# Patient Record
Sex: Female | Born: 1960 | Race: Black or African American | Hispanic: No | Marital: Married | Smoking: Never smoker
Health system: Southern US, Community
[De-identification: ages and names within clinical notes are randomized; demographics above are authoritative.]

## PROBLEM LIST (undated history)

## (undated) DIAGNOSIS — I10 Essential (primary) hypertension: Secondary | ICD-10-CM

## (undated) DIAGNOSIS — M1990 Unspecified osteoarthritis, unspecified site: Secondary | ICD-10-CM

## (undated) DIAGNOSIS — E119 Type 2 diabetes mellitus without complications: Secondary | ICD-10-CM

## (undated) DIAGNOSIS — J45909 Unspecified asthma, uncomplicated: Secondary | ICD-10-CM

## (undated) HISTORY — PX: ABDOMINAL HYSTERECTOMY: SHX81

## (undated) HISTORY — DX: Type 2 diabetes mellitus without complications: E11.9

## (undated) HISTORY — DX: Unspecified osteoarthritis, unspecified site: M19.90

---

## 2015-08-28 ENCOUNTER — Emergency Department (HOSPITAL_COMMUNITY)
Admission: EM | Admit: 2015-08-28 | Discharge: 2015-08-28 | Disposition: A | Discharge status: Home / Self Care | Payer: Self-pay | Attending: Emergency Medicine | Admitting: Emergency Medicine

## 2015-08-28 ENCOUNTER — Encounter (HOSPITAL_COMMUNITY): Payer: Self-pay | Admitting: Emergency Medicine

## 2015-08-28 DIAGNOSIS — E669 Obesity, unspecified: Secondary | ICD-10-CM | POA: Insufficient documentation

## 2015-08-28 DIAGNOSIS — Z7982 Long term (current) use of aspirin: Secondary | ICD-10-CM | POA: Insufficient documentation

## 2015-08-28 DIAGNOSIS — Z79899 Other long term (current) drug therapy: Secondary | ICD-10-CM | POA: Insufficient documentation

## 2015-08-28 DIAGNOSIS — R011 Cardiac murmur, unspecified: Secondary | ICD-10-CM | POA: Insufficient documentation

## 2015-08-28 DIAGNOSIS — J45909 Unspecified asthma, uncomplicated: Secondary | ICD-10-CM | POA: Insufficient documentation

## 2015-08-28 DIAGNOSIS — I10 Essential (primary) hypertension: Secondary | ICD-10-CM | POA: Insufficient documentation

## 2015-08-28 HISTORY — DX: Essential (primary) hypertension: I10

## 2015-08-28 HISTORY — DX: Unspecified asthma, uncomplicated: J45.909

## 2015-08-28 LAB — BRAIN NATRIURETIC PEPTIDE: B Natriuretic Peptide: 31.9 pg/mL (ref 0.0–100.0)

## 2015-08-28 LAB — BASIC METABOLIC PANEL
ANION GAP: 11 (ref 5–15)
BUN: 10 mg/dL (ref 6–20)
CALCIUM: 9.6 mg/dL (ref 8.9–10.3)
CHLORIDE: 101 mmol/L (ref 101–111)
CO2: 28 mmol/L (ref 22–32)
Creatinine, Ser: 0.82 mg/dL (ref 0.44–1.00)
GFR calc non Af Amer: >60 mL/min (ref >60)
Glucose, Bld: 124 mg/dL — ABNORMAL HIGH (ref 65–99)
Potassium: 3.7 mmol/L (ref 3.5–5.1)
Sodium: 140 mmol/L (ref 135–145)

## 2015-08-28 MED ORDER — LABETALOL HCL 5 MG/ML IV SOLN
20.0000 mg | Freq: Once | INTRAVENOUS | Status: AC
  Administered 2015-08-28: 20 mg via INTRAVENOUS
  Filled 2015-08-28: qty 4

## 2015-08-28 MED ORDER — LISINOPRIL 20 MG PO TABS
20.0000 mg | ORAL_TABLET | Freq: Once | ORAL | Status: AC
  Administered 2015-08-28: 20 mg via ORAL
  Filled 2015-08-28: qty 1

## 2015-08-28 MED ORDER — LISINOPRIL 20 MG PO TABS: 20.0000 mg | ORAL_TABLET | Freq: Every day | ORAL | Status: DC

## 2015-08-28 NOTE — Discharge Instructions (Signed)
Hypertension Hypertension, commonly called high blood pressure, is when the force of blood pumping through your arteries is too strong. Your arteries are the blood vessels that carry blood from your heart throughout your body. A blood pressure reading consists of a higher number over a lower number, such as 110/72. The higher number (systolic) is the pressure inside your arteries when your heart pumps. The lower number (diastolic) is the pressure inside your arteries when your heart relaxes. Ideally you want your blood pressure below 120/80. Hypertension forces your heart to work harder to pump blood. Your arteries may become narrow or stiff. Having untreated or uncontrolled hypertension can cause heart attack, stroke, kidney disease, and other problems. RISK FACTORS Some risk factors for high blood pressure are controllable. Others are not.  Risk factors you cannot control include:   Race. You may be at higher risk if you are African American.  Age. Risk increases with age.  Gender. Men are at higher risk than women before age 45 years. After age 65, women are at higher risk than men. Risk factors you can control include:  Not getting enough exercise or physical activity.  Being overweight.  Getting too much fat, sugar, calories, or salt in your diet.  Drinking too much alcohol. SIGNS AND SYMPTOMS Hypertension does not usually cause signs or symptoms. Extremely high blood pressure (hypertensive crisis) may cause headache, anxiety, shortness of breath, and nosebleed. DIAGNOSIS To check if you have hypertension, your health care provider will measure your blood pressure while you are seated, with your arm held at the level of your heart. It should be measured at least twice using the same arm. Certain conditions can cause a difference in blood pressure between your right and left arms. A blood pressure reading that is higher than normal on one occasion does not mean that you need treatment. If  it is not clear whether you have high blood pressure, you may be asked to return on a different day to have your blood pressure checked again. Or, you may be asked to monitor your blood pressure at home for 1 or more weeks. TREATMENT Treating high blood pressure includes making lifestyle changes and possibly taking medicine. Living a healthy lifestyle can help lower high blood pressure. You may need to change some of your habits. Lifestyle changes may include:  Following the DASH diet. This diet is high in fruits, vegetables, and whole grains. It is low in salt, red meat, and added sugars.  Keep your sodium intake below 2,300 mg per day.  Getting at least 30-45 minutes of aerobic exercise at least 4 times per week.  Losing weight if necessary.  Not smoking.  Limiting alcoholic beverages.  Learning ways to reduce stress. Your health care provider may prescribe medicine if lifestyle changes are not enough to get your blood pressure under control, and if one of the following is true:  You are 18-59 years of age and your systolic blood pressure is above 140.  You are 60 years of age or older, and your systolic blood pressure is above 150.  Your diastolic blood pressure is above 90.  You have diabetes, and your systolic blood pressure is over 140 or your diastolic blood pressure is over 90.  You have kidney disease and your blood pressure is above 140/90.  You have heart disease and your blood pressure is above 140/90. Your personal target blood pressure may vary depending on your medical conditions, your age, and other factors. HOME CARE INSTRUCTIONS    Have your blood pressure rechecked as directed by your health care provider.   Take medicines only as directed by your health care provider. Follow the directions carefully. Blood pressure medicines must be taken as prescribed. The medicine does not work as well when you skip doses. Skipping doses also puts you at risk for  problems.  Do not smoke.   Monitor your blood pressure at home as directed by your health care provider. SEEK MEDICAL CARE IF:   You think you are having a reaction to medicines taken.  You have recurrent headaches or feel dizzy.  You have swelling in your ankles.  You have trouble with your vision. SEEK IMMEDIATE MEDICAL CARE IF:  You develop a severe headache or confusion.  You have unusual weakness, numbness, or feel faint.  You have severe chest or abdominal pain.  You vomit repeatedly.  You have trouble breathing. MAKE SURE YOU:   Understand these instructions.  Will watch your condition.  Will get help right away if you are not doing well or get worse.   This information is not intended to replace advice given to you by your health care provider. Make sure you discuss any questions you have with your health care provider.   Document Released: 07/10/2005 Document Revised: 11/24/2014 Document Reviewed: 05/02/2013 Elsevier Interactive Patient Education 2016 [employer]vier Inc[employer]  

## 2015-08-28 NOTE — ED Provider Notes (Signed)
CSN: [id number]     Arrival date & time 08/28/15  0945 History   First MD Initiated Contact with Patient 08/28/15 [phone]     Chief Complaint  Patient presents with  . Hypertension  . Headache      HPI  Patient presents for evaluation accompanied by her daughter because of high blood pressure readings at home.  Patient has a history of hypertension. Is not taking medication for 6-7 years. Does not recall the last medicine she was on. She has a minimal headache, which she states is not unusual for her. She denies edema in her legs. No chest pain or shortness of breath. Normal urine output.  Essentially asymptomatic. However, her daughter brought her blood pressure cuff may been checking her blood pressure for 2-3 days. She's consistently over 200 systolic, over 100-120 diastolic. They became concerned. They could not find local primary care over the last few days and thus I brought her here.  Past Medical History  Diagnosis Date  . Hypertension   . Asthma    History reviewed. No pertinent past surgical history. History reviewed. No pertinent family history. Social History  Substance Use Topics  . Smoking status: Never Smoker   . Smokeless tobacco: None  . Alcohol Use: No   OB History    No data available     Review of Systems  Constitutional: Negative for fever, chills, diaphoresis, appetite change and fatigue.  HENT: Negative for mouth sores, sore throat and trouble swallowing.   Eyes: Negative for visual disturbance.  Respiratory: Negative for cough, chest tightness, shortness of breath and wheezing.   Cardiovascular: Negative for chest pain.  Gastrointestinal: Negative for nausea, vomiting, abdominal pain, diarrhea and abdominal distention.  Endocrine: Negative for polydipsia, polyphagia and polyuria.  Genitourinary: Negative for dysuria, frequency and hematuria.  Musculoskeletal: Negative for gait problem.  Skin: Negative for color change, pallor and rash.  Neurological:  Negative for dizziness, syncope, light-headedness and headaches.  Hematological: Does not bruise/bleed easily.  Psychiatric/Behavioral: Negative for behavioral problems and confusion.      Allergies  Review of patient's allergies indicates no known allergies.  Home Medications   Prior to Admission medications   Medication Sig Start Date End Date Taking? Authorizing Provider  aspirin EC 81 MG tablet Take 81 mg by mouth daily.   Yes Historical Provider, MD  calcium-vitamin D (OSCAL WITH D) 500-200 MG-UNIT tablet Take 1 tablet by mouth daily with breakfast.   Yes Historical Provider, MD  Multiple Vitamins-Minerals (MULTIVITAMIN & MINERAL PO) Take 1 tablet by mouth daily.   Yes Historical Provider, MD  Omega-3 Fatty Acids (FISH OIL) 1000 MG CAPS Take 1 capsule by mouth daily.   Yes Historical Provider, MD  lisinopril (PRINIVIL,ZESTRIL) 20 MG tablet Take 1 tablet (20 mg total) by mouth daily. 08/28/15   Rolland Porter, MD   BP 188/108 mmHg  Pulse 65  Temp(Src) 98.6 F (37 C) (Oral)  Resp 18  SpO2 100% Physical Exam  Constitutional: She is oriented to person, place, and time. She appears well-developed. No distress.  Obese pleasant black female. No dyspnea with conversation.  HENT:  Head: Normocephalic.  Eyes: Conjunctivae are normal. Pupils are equal, round, and reactive to light. No scleral icterus.  Neck: Normal range of motion. Neck supple. No thyromegaly present.  Cardiovascular: Normal rate and regular rhythm.  Exam reveals no gallop and no friction rub.   Murmur heard.  Systolic murmur is present with a grade of 2/6  2/6 systolic murmur. No  S3 or 4 gallop. Sinus rhythm. Rate 79. Recheck blood pressure the bedside to 22/112.  Pulmonary/Chest: Effort normal and breath sounds normal. No respiratory distress. She has no wheezes. She has no rales.  Clear lungs.  Abdominal: Soft. Bowel sounds are normal. She exhibits no distension. There is no tenderness. There is no rebound.   Musculoskeletal: Normal range of motion.  Neurological: She is alert and oriented to person, place, and time.  Skin: Skin is warm and dry. No rash noted.  No peripheral edema.  Psychiatric: She has a normal mood and affect. Her behavior is normal.    ED Course  Procedures (including critical care time) Labs Review Labs Reviewed  BASIC METABOLIC PANEL - Abnormal; Notable for the following:    Glucose, Bld 124 (*)    All other components within normal limits  BRAIN NATRIURETIC PEPTIDE    Imaging Review No results found. I have personally reviewed and evaluated these images and lab results as part of my medical decision-making.   EKG Interpretation None      MDM   Final diagnoses:  Essential hypertension    Plan will be initial blood pressure reduction. 20 mg IV Lopressor, first dose by mouth lisinopril. Check EKG and renal function.  12:01pm  patient continues to feel well. Reassuring labs. EKG without LVH or other concerns. Blood pressure has improved. Recheck 180/103, then 165/87. Endocrine without orthostasis. Plan is home. Encourage diet, exercise, weight control, fat and salt restriction, blood pressure compliance. Given prescription for lisinopril which she takes she will be able to afford, and take.    Rolland Porter, MD 08/28/15 1200

## 2015-08-28 NOTE — ED Notes (Signed)
Pt complaint of hypertension; reports high BP readings for 2 days in a row; hx of medication for BP; has not taken in 6-7 years; complaint of slight headache.

## 2015-09-10 ENCOUNTER — Encounter: Payer: Self-pay | Admitting: Family Medicine

## 2015-09-10 ENCOUNTER — Ambulatory Visit: Payer: Self-pay | Attending: Family Medicine | Admitting: Family Medicine

## 2015-09-10 VITALS — BP 182/94 | HR 66 | Temp 98.3°F | Resp 15 | Ht 63.5 in | Wt 248.4 lb

## 2015-09-10 DIAGNOSIS — Z7982 Long term (current) use of aspirin: Secondary | ICD-10-CM | POA: Insufficient documentation

## 2015-09-10 DIAGNOSIS — M2550 Pain in unspecified joint: Secondary | ICD-10-CM | POA: Insufficient documentation

## 2015-09-10 DIAGNOSIS — I10 Essential (primary) hypertension: Secondary | ICD-10-CM | POA: Insufficient documentation

## 2015-09-10 DIAGNOSIS — M25551 Pain in right hip: Secondary | ICD-10-CM | POA: Insufficient documentation

## 2015-09-10 DIAGNOSIS — M25552 Pain in left hip: Secondary | ICD-10-CM | POA: Insufficient documentation

## 2015-09-10 DIAGNOSIS — Z79899 Other long term (current) drug therapy: Secondary | ICD-10-CM | POA: Insufficient documentation

## 2015-09-10 DIAGNOSIS — E119 Type 2 diabetes mellitus without complications: Secondary | ICD-10-CM | POA: Insufficient documentation

## 2015-09-10 DIAGNOSIS — Z6841 Body Mass Index (BMI) 40.0 and over, adult: Secondary | ICD-10-CM | POA: Insufficient documentation

## 2015-09-10 DIAGNOSIS — E669 Obesity, unspecified: Secondary | ICD-10-CM | POA: Insufficient documentation

## 2015-09-10 LAB — LIPID PANEL
Cholesterol: 261 mg/dL — ABNORMAL HIGH (ref 125–200)
HDL: 49 mg/dL (ref >=46)
LDL CALC: 180 mg/dL — AB (ref <130)
TRIGLYCERIDES: 158 mg/dL — AB (ref <150)
Total CHOL/HDL Ratio: 5.3 Ratio — ABNORMAL HIGH (ref <=5.0)
VLDL: 32 mg/dL — AB (ref <30)

## 2015-09-10 LAB — COMPLETE METABOLIC PANEL WITH GFR
ALT: 15 U/L (ref 6–29)
AST: 17 U/L (ref 10–35)
Albumin: 4.2 g/dL (ref 3.6–5.1)
Alkaline Phosphatase: 65 U/L (ref 33–130)
BILIRUBIN TOTAL: 0.7 mg/dL (ref 0.2–1.2)
BUN: 12 mg/dL (ref 7–25)
CHLORIDE: 103 mmol/L (ref 98–110)
CO2: 31 mmol/L (ref 20–31)
CREATININE: 0.78 mg/dL (ref 0.50–1.05)
Calcium: 9.7 mg/dL (ref 8.6–10.4)
GFR, Est Non African American: 85 mL/min (ref >=60)
Glucose, Bld: 91 mg/dL (ref 65–99)
Potassium: 4.5 mmol/L (ref 3.5–5.3)
Sodium: 142 mmol/L (ref 135–146)
TOTAL PROTEIN: 7.3 g/dL (ref 6.1–8.1)

## 2015-09-10 LAB — POCT GLYCOSYLATED HEMOGLOBIN (HGB A1C): Hemoglobin A1C: 7

## 2015-09-10 MED ORDER — GLUCOSE BLOOD VI STRP: ORAL_STRIP | Status: DC

## 2015-09-10 MED ORDER — TRUEPLUS LANCETS 28G MISC: 1.0000 | Freq: Three times a day (TID) | Status: DC

## 2015-09-10 MED ORDER — TRUE METRIX METER DEVI: 1.0000 | Freq: Three times a day (TID) | Status: DC

## 2015-09-10 MED ORDER — LISINOPRIL-HYDROCHLOROTHIAZIDE 10-12.5 MG PO TABS: 2.0000 | ORAL_TABLET | Freq: Every day | ORAL | Status: DC

## 2015-09-10 MED ORDER — METFORMIN HCL 500 MG PO TABS: 500.0000 mg | ORAL_TABLET | Freq: Two times a day (BID) | ORAL | Status: DC

## 2015-09-10 MED FILL — LISINOPRIL-HCTZ 10-12.5 MG: 10-12.5 | 30 days supply | Qty: 60 | Fill #0

## 2015-09-10 MED FILL — !TRUE METRIX BLOOD GLUCOSE: 365 days supply | Qty: 1 | Fill #0

## 2015-09-10 MED FILL — TRUE METRIX TEST STRIP: 33 days supply | Qty: 100 | Fill #0

## 2015-09-10 MED FILL — TRUEplus LANCETS 28G MISC: 33 days supply | Qty: 100 | Fill #0

## 2015-09-10 MED FILL — metFORMIN HCL 500 MG TABS: 500 | 30 days supply | Qty: 60 | Fill #0

## 2015-09-10 NOTE — Patient Instructions (Signed)
Diabetes Mellitus and Food It is important for you to manage your blood sugar (glucose) level. Your blood glucose level can be greatly affected by what you eat. Eating healthier foods in the appropriate amounts throughout the day at about the same time each day will help you control your blood glucose level. It can also help slow or prevent worsening of your diabetes mellitus. Healthy eating may even help you improve the level of your blood pressure and reach or maintain a healthy weight.  General recommendations for healthful eating and cooking habits include:  Eating meals and snacks regularly. Avoid going long periods of time without eating to lose weight.  Eating a diet that consists mainly of plant-based foods, such as fruits, vegetables, nuts, legumes, and whole grains.  Using low-heat cooking methods, such as baking, instead of high-heat cooking methods, such as deep frying. Work with your dietitian to make sure you understand how to use the Nutrition Facts information on food labels. HOW CAN FOOD AFFECT ME? Carbohydrates Carbohydrates affect your blood glucose level more than any other type of food. Your dietitian will help you determine how many carbohydrates to eat at each meal and teach you how to count carbohydrates. Counting carbohydrates is important to keep your blood glucose at a healthy level, especially if you are using insulin or taking certain medicines for diabetes mellitus. Alcohol Alcohol can cause sudden decreases in blood glucose (hypoglycemia), especially if you use insulin or take certain medicines for diabetes mellitus. Hypoglycemia can be a life-threatening condition. Symptoms of hypoglycemia (sleepiness, dizziness, and disorientation) are similar to symptoms of having too much alcohol.  If your health care provider has given you approval to drink alcohol, do so in moderation and use the following guidelines:  Women should not have more than one drink per day, and men  should not have more than two drinks per day. One drink is equal to:  12 oz of beer.  5 oz of wine.  1 oz of hard liquor.  Do not drink on an empty stomach.  Keep yourself hydrated. Have water, diet soda, or unsweetened iced tea.  Regular soda, juice, and other mixers might contain a lot of carbohydrates and should be counted. WHAT FOODS ARE NOT RECOMMENDED? As you make food choices, it is important to remember that all foods are not the same. Some foods have fewer nutrients per serving than other foods, even though they might have the same number of calories or carbohydrates. It is difficult to get your body what it needs when you eat foods with fewer nutrients. Examples of foods that you should avoid that are high in calories and carbohydrates but low in nutrients include:  Trans fats (most processed foods list trans fats on the Nutrition Facts label).  Regular soda.  Juice.  Candy.  Sweets, such as cake, pie, doughnuts, and cookies.  Fried foods. WHAT FOODS CAN I EAT? Eat nutrient-rich foods, which will nourish your body and keep you healthy. The food you should eat also will depend on several factors, including:  The calories you need.  The medicines you take.  Your weight.  Your blood glucose level.  Your blood pressure level.  Your cholesterol level. You should eat a variety of foods, including:  Protein.  Lean cuts of meat.  Proteins low in saturated fats, such as fish, egg whites, and beans. Avoid processed meats.  Fruits and vegetables.  Fruits and vegetables that may help control blood glucose levels, such as apples, mangoes, and   yams.  Dairy products.  Choose fat-free or low-fat dairy products, such as milk, yogurt, and cheese.  Grains, bread, pasta, and rice.  Choose whole grain products, such as multigrain bread, whole oats, and brown rice. These foods may help control blood pressure.  Fats.  Foods containing healthful fats, such as nuts,  avocado, olive oil, canola oil, and fish. DOES EVERYONE WITH DIABETES MELLITUS HAVE THE SAME MEAL PLAN? Because every person with diabetes mellitus is different, there is not one meal plan that works for everyone. It is very important that you meet with a dietitian who will help you create a meal plan that is just right for you.   This information is not intended to replace advice given to you by your health care provider. Make sure you discuss any questions you have with your health care provider.   Document Released: 04/06/2005 Document Revised: 07/31/2014 Document Reviewed: 06/06/2013 Elsevier Interactive Patient Education 2016 Elsevier Inc.  

## 2015-09-10 NOTE — Progress Notes (Signed)
Subjective:  Patient ID: Paula Serrano, female    DOB: 31-May-1960  Age: 55 y.o. MRN: [id number]  CC: Follow-up   HPI Paula Serrano is a 55 year old woman originally from [place] who comes into the clinic to establish care today. She was recently seen at [facility] ED and blood pressure was in the accelerated range with systolics in the 200s. She was placed on lisinopril and referred to the clinic.  Today she is accompanied by her daughter and reports compliance with lisinopril however she brings in her blood pressure log from home which reveals systolics in the 160-180. She also complains of intermittent pain in her hips, knees and ankles for which she usually uses Aleve occasionally. Her blood pressure is elevated today and she reports not taking her antihypertensive in anticipation of fasting labs. Point-of-care A1c reveals a new diagnosis of type 2 diabetes mellitus with A1c of 7.  Outpatient Prescriptions Prior to Visit  Medication Sig Dispense Refill  . aspirin EC 81 MG tablet Take 81 mg by mouth daily.    . calcium-vitamin D (OSCAL WITH D) 500-200 MG-UNIT tablet Take 1 tablet by mouth daily with breakfast.    . Multiple Vitamins-Minerals (MULTIVITAMIN & MINERAL PO) Take 1 tablet by mouth daily.    . Omega-3 Fatty Acids (FISH OIL) 1000 MG CAPS Take 1 capsule by mouth daily.    Marland Kitchen lisinopril (PRINIVIL,ZESTRIL) 20 MG tablet Take 1 tablet (20 mg total) by mouth daily. 30 tablet 0   No facility-administered medications prior to visit.    ROS Review of Systems  Constitutional: Negative for activity change, appetite change and fatigue.  HENT: Negative for congestion, sinus pressure and sore throat.   Eyes: Negative for visual disturbance.  Respiratory: Negative for cough, chest tightness, shortness of breath and wheezing.   Cardiovascular: Negative for chest pain and palpitations.  Gastrointestinal: Negative for abdominal pain, constipation and abdominal distention.  Endocrine:  Negative for polydipsia.  Genitourinary: Negative for dysuria and frequency.  Musculoskeletal: Negative for back pain and arthralgias.       Joint aches in the hips, knees and ankles  Skin: Negative for rash.  Neurological: Negative for tremors, light-headedness and numbness.  Hematological: Does not bruise/bleed easily.  Psychiatric/Behavioral: Negative for behavioral problems and agitation.    Objective:  BP 182/94 mmHg  Pulse 66  Temp(Src) 98.3 F (36.8 C)  Resp 15  Ht 5' 3.5" (1.613 m)  Wt 248 lb 6.4 oz (112.674 kg)  BMI 43.31 kg/m2  SpO2 100%  BP/Weight 09/10/2015 08/28/2015  Systolic BP 182 139  Diastolic BP 94 88  Wt. (Lbs) 248.4 -  BMI 43.31 -      Physical Exam  Constitutional: She is oriented to person, place, and time. She appears well-developed and well-nourished.  Cardiovascular: Normal rate, normal heart sounds and intact distal pulses.   No murmur heard. Pulmonary/Chest: Effort normal and breath sounds normal. She has no wheezes. She has no rales. She exhibits no tenderness.  Abdominal: Soft. Bowel sounds are normal. She exhibits no distension and no mass. There is no tenderness.  Musculoskeletal: Normal range of motion.  Neurological: She is alert and oriented to person, place, and time.     Assessment & Plan:   1. Type 2 diabetes mellitus without complication, without long-term current use of insulin (HCC) Newly diagnosed with A1c of 7. Counseled on new diagnosis, management options and complications. Scheduled to see the clinical pharmacist for diabetic teaching. Advised to check blood sugars 3 times daily-before  meals 2 hours postlunch and at bedtime. We'll discuss diabetic healthcare maintenance at the next office visit - HgB A1c - COMPLETE METABOLIC PANEL WITH GFR - Lipid panel - Microalbumin/Creatinine Ratio, Urine - TRUEPLUS LANCETS 28G MISC; 1 each by Does not apply route 3 (three) times daily before meals.  Dispense: 100 each; Refill: 12 -  Blood Glucose Monitoring Suppl (TRUE METRIX METER) DEVI; 1 each by Does not apply route 3 (three) times daily before meals.  Dispense: 1 Device; Refill: 0 - glucose blood (TRUE METRIX BLOOD GLUCOSE TEST) test strip; Use 3 times daily before meals  Dispense: 100 each; Refill: 12 - metFORMIN (GLUCOPHAGE) 500 MG tablet; Take 1 tablet (500 mg total) by mouth 2 (two) times daily with a meal.  Dispense: 60 tablet; Refill: 3  2. Essential hypertension Uncontrolled. Patient is yet to take morning dose of hypertensive. Will replace lisinopril with lisinopril/HCTZ given home blood pressure log reveals elevated blood pressures despite compliance with lisinopril Low-sodium, DASH diet - lisinopril-hydrochlorothiazide (PRINZIDE,ZESTORETIC) 10-12.5 MG tablet; Take 2 tablets by mouth daily.  Dispense: 60 tablet; Refill: 3  3. Obesity Advised to increase physical activity, reduce portion sizes  4. Arthralgia Could be secondary to underlying osteoarthritis. Will hold off on NSAIDs at this time due to elevated blood pressure I will reassess at next visit if blood pressure can tolerate this.   Meds ordered this encounter  Medications  . TRUEPLUS LANCETS 28G MISC    Sig: 1 each by Does not apply route 3 (three) times daily before meals.    Dispense:  100 each    Refill:  12  . Blood Glucose Monitoring Suppl (TRUE METRIX METER) DEVI    Sig: 1 each by Does not apply route 3 (three) times daily before meals.    Dispense:  1 Device    Refill:  0  . glucose blood (TRUE METRIX BLOOD GLUCOSE TEST) test strip    Sig: Use 3 times daily before meals    Dispense:  100 each    Refill:  12  . metFORMIN (GLUCOPHAGE) 500 MG tablet    Sig: Take 1 tablet (500 mg total) by mouth 2 (two) times daily with a meal.    Dispense:  60 tablet    Refill:  3  . lisinopril-hydrochlorothiazide (PRINZIDE,ZESTORETIC) 10-12.5 MG tablet    Sig: Take 2 tablets by mouth daily.    Dispense:  60 tablet    Refill:  3    Discontinue  lisinopril    Follow-up: Return in about 2 weeks (around 09/24/2015) for Follow-up on hypertension and diabetes mellitus; place on Stacey's schedule for diabetic teaching.   Jaclyn Shaggy MD

## 2015-09-10 NOTE — Progress Notes (Signed)
Patient did not take any medications this am She is fasting She reports joint pain 7/10 from her hips down

## 2015-09-11 ENCOUNTER — Other Ambulatory Visit: Payer: Self-pay | Admitting: Family Medicine

## 2015-09-11 DIAGNOSIS — E785 Hyperlipidemia, unspecified: Secondary | ICD-10-CM | POA: Insufficient documentation

## 2015-09-11 LAB — MICROALBUMIN / CREATININE URINE RATIO
CREATININE, URINE: 336 mg/dL — AB (ref 20–320)
MICROALB UR: 27.6 mg/dL
MICROALB/CREAT RATIO: 82 ug/mg{creat} — AB (ref <30)

## 2015-09-11 MED ORDER — ATORVASTATIN CALCIUM 20 MG PO TABS: 20.0000 mg | ORAL_TABLET | Freq: Every day | ORAL | Status: DC

## 2015-09-13 ENCOUNTER — Telehealth: Payer: Self-pay | Admitting: *Deleted

## 2015-09-13 NOTE — Telephone Encounter (Signed)
Spoke to patient verified name and date of birth gave results and instructions.

## 2015-09-13 NOTE — Telephone Encounter (Signed)
-----   Message from Jaclyn Shaggy, MD sent at 09/11/2015  9:32 PM EST ----- Please inform her that her lipids are elevated and so i have sent a prescription for atorvastatin to the pharmacy. She also has microalbuminuria which could be secondary to uncontrolled hypertension coupled with DM and she is currently on an ACEI which should help with this.

## 2015-09-21 ENCOUNTER — Encounter: Payer: Self-pay | Admitting: Pharmacist

## 2015-09-21 ENCOUNTER — Ambulatory Visit: Payer: Self-pay | Attending: Family Medicine | Admitting: Pharmacist

## 2015-09-21 VITALS — BP 137/91 | HR 82

## 2015-09-21 DIAGNOSIS — E119 Type 2 diabetes mellitus without complications: Secondary | ICD-10-CM

## 2015-09-21 DIAGNOSIS — I10 Essential (primary) hypertension: Secondary | ICD-10-CM

## 2015-09-21 NOTE — Patient Instructions (Signed)
Follow up with Dr. Venetia Night  Basic Carbohydrate Counting for Diabetes Mellitus Carbohydrate counting is a method for keeping track of the amount of carbohydrates you eat. Eating carbohydrates naturally increases the level of sugar (glucose) in your blood, so it is important for you to know the amount that is okay for you to have in every meal. Carbohydrate counting helps keep the level of glucose in your blood within normal limits. The amount of carbohydrates allowed is different for every person. A dietitian can help you calculate the amount that is right for you. Once you know the amount of carbohydrates you can have, you can count the carbohydrates in the foods you want to eat. Carbohydrates are found in the following foods:  Grains, such as breads and cereals.  Dried beans and soy products.  Starchy vegetables, such as potatoes, peas, and corn.  Fruit and fruit juices.  Milk and yogurt.  Sweets and snack foods, such as cake, cookies, candy, chips, soft drinks, and fruit drinks. CARBOHYDRATE COUNTING There are two ways to count the carbohydrates in your food. You can use either of the methods or a combination of both. Reading the "Nutrition Facts" on Packaged Food The "Nutrition Facts" is an area that is included on the labels of almost all packaged food and beverages in the [place]. It includes the serving size of that food or beverage and information about the nutrients in each serving of the food, including the grams (g) of carbohydrate per serving.  Decide the number of servings of this food or beverage that you will be able to eat or drink. Multiply that number of servings by the number of grams of carbohydrate that is listed on the label for that serving. The total will be the amount of carbohydrates you will be having when you eat or drink this food or beverage. Learning Standard Serving Sizes of Food When you eat food that is not packaged or does not include "Nutrition Facts" on  the label, you need to measure the servings in order to count the amount of carbohydrates.A serving of most carbohydrate-rich foods contains about 15 g of carbohydrates. The following list includes serving sizes of carbohydrate-rich foods that provide 15 g ofcarbohydrate per serving:   1 slice of bread (1 oz) or 1 six-inch tortilla.    of a hamburger bun or English muffin.  4-6 crackers.   cup unsweetened dry cereal.    cup hot cereal.   cup rice or pasta.    cup mashed potatoes or  of a large baked potato.  1 cup fresh fruit or one small piece of fruit.    cup canned or frozen fruit or fruit juice.  1 cup milk.   cup plain fat-free yogurt or yogurt sweetened with artificial sweeteners.   cup cooked dried beans or starchy vegetable, such as peas, corn, or potatoes.  Decide the number of standard-size servings that you will eat. Multiply that number of servings by 15 (the grams of carbohydrates in that serving). For example, if you eat 2 cups of strawberries, you will have eaten 2 servings and 30 g of carbohydrates (2 servings x 15 g = 30 g). For foods such as soups and casseroles, in which more than one food is mixed in, you will need to count the carbohydrates in each food that is included. EXAMPLE OF CARBOHYDRATE COUNTING Sample Dinner  3 oz chicken breast.   cup of brown rice.   cup of corn.  1 cup milk.  1 cup strawberries with sugar-free whipped topping.  Carbohydrate Calculation Step 1: Identify the foods that contain carbohydrates:   Rice.   Corn.   Milk.   Strawberries. Step 2:Calculate the number of servings eaten of each:   2 servings of rice.   1 serving of corn.   1 serving of milk.   1 serving of strawberries. Step 3: Multiply each of those number of servings by 15 g:   2 servings of rice x 15 g = 30 g.   1 serving of corn x 15 g = 15 g.   1 serving of milk x 15 g = 15 g.   1 serving of strawberries x 15 g =  15 g. Step 4: Add together all of the amounts to find the total grams of carbohydrates eaten: 30 g + 15 g + 15 g + 15 g = 75 g.   This information is not intended to replace advice given to you by your health care provider. Make sure you discuss any questions you have with your health care provider.   Document Released: 07/10/2005 Document Revised: 07/31/2014 Document Reviewed: 06/06/2013 Elsevier Interactive Patient Education 2016 [employer] Inc[employer]  Blood Glucose Monitoring, Adult Monitoring your blood glucose (also know as blood sugar) helps you to manage your diabetes. It also helps you and your health care provider monitor your diabetes and determine how well your treatment plan is working. WHY SHOULD YOU MONITOR YOUR BLOOD GLUCOSE?  It can help you understand how food, exercise, and medicine affect your blood glucose.  It allows you to know what your blood glucose is at any given moment. You can quickly tell if you are having low blood glucose (hypoglycemia) or high blood glucose (hyperglycemia).  It can help you and your health care provider know how to adjust your medicines.  It can help you understand how to manage an illness or adjust medicine for exercise. WHEN SHOULD YOU TEST? Your health care provider will help you decide how often you should check your blood glucose. This may depend on the type of diabetes you have, your diabetes control, or the types of medicines you are taking. Be sure to write down all of your blood glucose readings so that this information can be reviewed with your health care provider. See below for examples of testing times that your health care provider may suggest. Type 1 Diabetes  Test at least 2 times per day if your diabetes is well controlled, if you are using an insulin pump, or if you perform multiple daily injections.  If your diabetes is not well controlled or if you are sick, you may need to test more often.  It is a good idea to also  test:  Before every insulin injection.  Before and after exercise.  Between meals and 2 hours after a meal.  Occasionally between 2:00 a.m. and 3:00 a.m. Type 2 Diabetes  If you are taking insulin, test at least 2 times per day. However, it is best to test before every insulin injection.  If you take medicines by mouth (orally), test 2 times a day.  If you are on a controlled diet, test once a day.  If your diabetes is not well controlled or if you are sick, you may need to monitor more often. HOW TO MONITOR YOUR BLOOD GLUCOSE Supplies Needed  Blood glucose meter.  Test strips for your meter. Each meter has its own strips. You must use the strips that go with your  own meter.  A pricking needle (lancet).  A device that holds the lancet (lancing device).  A journal or log book to write down your results. Procedure  Wash your hands with soap and water. Alcohol is not preferred.  Prick the side of your finger (not the tip) with the lancet.  Gently milk the finger until a small drop of blood appears.  Follow the instructions that come with your meter for inserting the test strip, applying blood to the strip, and using your blood glucose meter. Other Areas to Get Blood for Testing Some meters allow you to use other areas of your body (other than your finger) to test your blood. These areas are called alternative sites. The most common alternative sites are:  The forearm.  The thigh.  The back area of the lower leg.  The palm of the hand. The blood flow in these areas is slower. Therefore, the blood glucose values you get may be delayed, and the numbers are different from what you would get from your fingers. Do not use alternative sites if you think you are having hypoglycemia. Your reading will not be accurate. Always use a finger if you are having hypoglycemia. Also, if you cannot feel your lows (hypoglycemia unawareness), always use your fingers for your blood glucose  checks. ADDITIONAL TIPS FOR GLUCOSE MONITORING  Do not reuse lancets.  Always carry your supplies with you.  All blood glucose meters have a 24-hour "hotline" number to call if you have questions or need help.  Adjust (calibrate) your blood glucose meter with a control solution after finishing a few boxes of strips. BLOOD GLUCOSE RECORD KEEPING It is a good idea to keep a daily record or log of your blood glucose readings. Most glucose meters, if not all, keep your glucose records stored in the meter. Some meters come with the ability to download your records to your home computer. Keeping a record of your blood glucose readings is especially helpful if you are wanting to look for patterns. Make notes to go along with the blood glucose readings because you might forget what happened at that exact time. Keeping good records helps you and your health care provider to work together to achieve good diabetes management.    This information is not intended to replace advice given to you by your health care provider. Make sure you discuss any questions you have with your health care provider.   Document Released: 07/13/2003 Document Revised: 07/31/2014 Document Reviewed: 12/02/2012 Elsevier Interactive Patient Education 2016 [employer].  Diabetes and Exercise Exercising regularly is important. It is not just about losing weight. It has many health benefits, such as:  Improving your overall fitness, flexibility, and endurance.  Increasing your bone density.  Helping with weight control.  Decreasing your body fat.  Increasing your muscle strength.  Reducing stress and tension.  Improving your overall health. People with diabetes who exercise gain additional benefits because exercise:  Reduces appetite.  Improves the body's use of blood sugar (glucose).  Helps lower or control blood glucose.  Decreases blood pressure.  Helps control blood lipids (such as cholesterol and  triglycerides).  Improves the body's use of the hormone insulin by:  Increasing the body's insulin sensitivity.  Reducing the body's insulin needs.  Decreases the risk for heart disease because exercising:  Lowers cholesterol and triglycerides levels.  Increases the levels of good cholesterol (such as high-density lipoproteins [HDL]) in the body.  Lowers blood glucose levels. YOUR ACTIVITY PLAN  Choose an activity that you enjoy, and set realistic goals. To exercise safely, you should begin practicing any new physical activity slowly, and gradually increase the intensity of the exercise over time. Your health care provider or diabetes educator can help create an activity plan that works for you. General recommendations include:  Encouraging children to engage in at least 60 minutes of physical activity each day.  Stretching and performing strength training exercises, such as yoga or weight lifting, at least 2 times per week.  Performing a total of at least 150 minutes of moderate-intensity exercise each week, such as brisk walking or water aerobics.  Exercising at least 3 days per week, making sure you allow no more than 2 consecutive days to pass without exercising.  Avoiding long periods of inactivity (90 minutes or more). When you have to spend an extended period of time sitting down, take frequent breaks to walk or stretch. RECOMMENDATIONS FOR EXERCISING WITH TYPE 1 OR TYPE 2 DIABETES   Check your blood glucose before exercising. If blood glucose levels are greater than 240 mg/dL, check for urine ketones. Do not exercise if ketones are present.  Avoid injecting insulin into areas of the body that are going to be exercised. For example, avoid injecting insulin into:  The arms when playing tennis.  The legs when jogging.  Keep a record of:  Food intake before and after you exercise.  Expected peak times of insulin action.  Blood glucose levels before and after you  exercise.  The type and amount of exercise you have done.  Review your records with your health care provider. Your health care provider will help you to develop guidelines for adjusting food intake and insulin amounts before and after exercising.  If you take insulin or oral hypoglycemic agents, watch for signs and symptoms of hypoglycemia. They include:  Dizziness.  Shaking.  Sweating.  Chills.  Confusion.  Drink plenty of water while you exercise to prevent dehydration or heat stroke. Body water is lost during exercise and must be replaced.  Talk to your health care provider before starting an exercise program to make sure it is safe for you. Remember, almost any type of activity is better than none.   This information is not intended to replace advice given to you by your health care provider. Make sure you discuss any questions you have with your health care provider.   Document Released: 09/30/2003 Document Revised: 11/24/2014 Document Reviewed: 12/17/2012 Elsevier Interactive Patient Education [employer]

## 2015-09-21 NOTE — Progress Notes (Signed)
S:    Patient arrives in good spirits with her daughter.  Presents for diabetes evaluation, education, and management at the request of Dr. Venetia Night. Patient was referred on 09/10/15.  Patient was last seen by Primary Care Provider on 09/10/15   Patient reports Diabetes was diagnosed in February 2017.   Patient reports adherence with medications.  Current diabetes medications include: metformin 500 mg BID Current hypertension medications include: lisinopril-HCTZ 10.12.5 mg (2 tablets) daily  Patient denies hypoglycemic events.  Patient reported dietary habits:patient has a lot of questions about what to eat. She eats a diet that is high in carbohydrates and she isn't sure how to substitute other things for those foods. She is very interested in working closely with a nutritionist.   Patient reported exercise habits: none   Patient reports nocturia.  Patient denies neuropathy. Patient denies visual changes. Patient reports self foot exams.    O:  Lab Results  Component Value Date   HGBA1C 7.0 09/10/2015   Filed Vitals:   09/21/15 1643  BP: 137/91  Pulse: 82    Home fasting CBG: 100s-160s  2 hour post-prandial/random CBG: <180  A/P: Diabetes newly diagnosed currently uncontrolled based on home CBGs. Patient denies hypoglycemic events and is able to verbalize appropriate hypoglycemia management plan. Patient reports adherence with medication. Control is suboptimal due to dietary indiscretion.  Continue current medications as prescribed. Poor understanding of diabetes. Patient educated on what diabetes is, the complications of diabetes, the A1c and goal A1c, goal blood glucose measures, basic carbohydrate counting, and the importance of exercise. Patient verbalized understanding of the education and all questions were addressed.   Next A1C anticipated May 2017.    ASCVD risk greater than 7.5%. Continued Aspirin 81 mg and Continued atorvastatin 20 mg - consider increasing to 40 mg for  high dose statin in the future.   Hypertension currently slightly above goal but patient admits to being nervous about coming to visit..  Patient reports adherence with medication. Control is suboptimal due to white coat hypertension - readings at home at been normal. Continue current medications and follow up with Dr. Venetia Night.   Written patient instructions provided.  Total time in face to face counseling 20 minutes.   Follow up in Pharmacist Clinic Visit as needed.

## 2015-09-24 ENCOUNTER — Ambulatory Visit: Payer: Self-pay | Attending: Family Medicine | Admitting: Family Medicine

## 2015-09-24 ENCOUNTER — Encounter: Payer: Self-pay | Admitting: Family Medicine

## 2015-09-24 VITALS — BP 151/96 | HR 79 | Temp 98.8°F | Resp 15 | Ht 63.5 in | Wt 244.0 lb

## 2015-09-24 DIAGNOSIS — E785 Hyperlipidemia, unspecified: Secondary | ICD-10-CM | POA: Insufficient documentation

## 2015-09-24 DIAGNOSIS — M25552 Pain in left hip: Secondary | ICD-10-CM | POA: Insufficient documentation

## 2015-09-24 DIAGNOSIS — Z7982 Long term (current) use of aspirin: Secondary | ICD-10-CM | POA: Insufficient documentation

## 2015-09-24 DIAGNOSIS — E669 Obesity, unspecified: Secondary | ICD-10-CM | POA: Insufficient documentation

## 2015-09-24 DIAGNOSIS — M25551 Pain in right hip: Secondary | ICD-10-CM | POA: Insufficient documentation

## 2015-09-24 DIAGNOSIS — Z79899 Other long term (current) drug therapy: Secondary | ICD-10-CM | POA: Insufficient documentation

## 2015-09-24 DIAGNOSIS — E119 Type 2 diabetes mellitus without complications: Secondary | ICD-10-CM | POA: Insufficient documentation

## 2015-09-24 DIAGNOSIS — M1990 Unspecified osteoarthritis, unspecified site: Secondary | ICD-10-CM | POA: Insufficient documentation

## 2015-09-24 DIAGNOSIS — Z7984 Long term (current) use of oral hypoglycemic drugs: Secondary | ICD-10-CM | POA: Insufficient documentation

## 2015-09-24 DIAGNOSIS — I10 Essential (primary) hypertension: Secondary | ICD-10-CM | POA: Insufficient documentation

## 2015-09-24 DIAGNOSIS — J45909 Unspecified asthma, uncomplicated: Secondary | ICD-10-CM | POA: Insufficient documentation

## 2015-09-24 DIAGNOSIS — M2550 Pain in unspecified joint: Secondary | ICD-10-CM | POA: Insufficient documentation

## 2015-09-24 LAB — GLUCOSE, POCT (MANUAL RESULT ENTRY): POC Glucose: 79 mg/dl (ref 70–99)

## 2015-09-24 MED ORDER — LISINOPRIL-HYDROCHLOROTHIAZIDE 20-12.5 MG PO TABS: 2.0000 | ORAL_TABLET | Freq: Every day | ORAL | Status: DC

## 2015-09-24 MED ORDER — ATORVASTATIN CALCIUM 20 MG PO TABS: 20.0000 mg | ORAL_TABLET | Freq: Every day | ORAL | Status: DC

## 2015-09-24 MED FILL — LISINOPRIL-HCTZ 20-12.5 MG: 20-12.5 | 30 days supply | Qty: 60 | Fill #0

## 2015-09-24 MED FILL — ATORVASTATIN 20 MG TABLET: 20 | 30 days supply | Qty: 30 | Fill #0

## 2015-09-24 NOTE — Progress Notes (Signed)
CC: Follow-up on hypertension and diabetes  HPI: Paula HartsGladys Serrano is a 55 y.o. female with newly diagnosed type 2 diabetes mellitus (A1c 7.5), hypertension, osteoarthritis who comes into the clinic for a follow-up visit.  She was commenced on metformin at her last office visit and the dose of her antihypertensive with increased but blood pressure is still mildly elevated today even though she states that home systolic blood pressures are in the 130s. I have reviewed her blood sugar log which indicates a random sugars less than 200 and fasting sugars in the 130 to 140 range. Since her last office visit she has been seen by the clinical pharmacist for diabetic teaching and has made changes to her lifestyle and exercises regularly also adhering to a diabetic diet   She would like to see a podiatrist because she thinks she has ingrown toenail in toenails of both big toes. Had also complained of intermittent pain in her hips, knees and ankles which was thought to be secondary to osteoarthritis Patient has No headache, No chest pain, No abdominal pain - No Nausea, No new weakness tingling or numbness, No Cough - SOB.  No Known Allergies Past Medical History  Diagnosis Date  . Hypertension   . Asthma    Current Outpatient Prescriptions on File Prior to Visit  Medication Sig Dispense Refill  . aspirin EC 81 MG tablet Take 81 mg by mouth daily.    . Blood Glucose Monitoring Suppl (TRUE METRIX METER) DEVI 1 each by Does not apply route 3 (three) times daily before meals. 1 Device 0  . calcium-vitamin D (OSCAL WITH D) 500-200 MG-UNIT tablet Take 1 tablet by mouth daily with breakfast.    . glucose blood (TRUE METRIX BLOOD GLUCOSE TEST) test strip Use 3 times daily before meals 100 each 12  . lisinopril-hydrochlorothiazide (PRINZIDE,ZESTORETIC) 10-12.5 MG tablet Take 2 tablets by mouth daily. 60 tablet 3  . metFORMIN (GLUCOPHAGE) 500 MG tablet Take 1 tablet (500 mg total) by mouth 2 (two) times daily  with a meal. 60 tablet 3  . Multiple Vitamins-Minerals (MULTIVITAMIN & MINERAL PO) Take 1 tablet by mouth daily.    . Omega-3 Fatty Acids (FISH OIL) 1000 MG CAPS Take 1 capsule by mouth daily.    . TRUEPLUS LANCETS 28G MISC 1 each by Does not apply route 3 (three) times daily before meals. 100 each 12  . atorvastatin (LIPITOR) 20 MG tablet Take 1 tablet (20 mg total) by mouth daily. (Patient not taking: Reported on 09/24/2015) 30 tablet 3   No current facility-administered medications on file prior to visit.   History reviewed. No pertinent family history. Social History   Social History  . Marital Status: Married    Spouse Name: N/A  . Number of Children: N/A  . Years of Education: N/A   Occupational History  . Not on file.   Social History Main Topics  . Smoking status: Never Smoker   . Smokeless tobacco: Not on file  . Alcohol Use: No  . Drug Use: No  . Sexual Activity: Not on file   Other Topics Concern  . Not on file   Social History Narrative    Review of Systems: Constitutional: Negative for fever, chills, diaphoresis, activity change, appetite change and fatigue. HENT: Negative for ear pain, nosebleeds, congestion, facial swelling, rhinorrhea, neck pain, neck stiffness and ear discharge.  Eyes: Negative for pain, discharge, redness, itching and visual disturbance. Respiratory: Negative for cough, choking, chest tightness, shortness of breath, wheezing  and stridor.  Cardiovascular: Negative for chest pain, palpitations and leg swelling. Gastrointestinal: Negative for abdominal distention. Genitourinary: Negative for dysuria, urgency, frequency, hematuria, flank pain, decreased urine volume, difficulty urinating and dyspareunia.  Musculoskeletal: Negative for back pain, joint swelling, arthralgias and gait problem. Neurological: Negative for dizziness, tremors, seizures, syncope, facial asymmetry, speech difficulty, weakness, light-headedness, numbness and headaches.    Hematological: Negative for adenopathy. Does not bruise/bleed easily. Psychiatric/Behavioral: Negative for hallucinations, behavioral problems, confusion, dysphoric mood, decreased concentration and agitation.    Objective:   Filed Vitals:   09/24/15 1616  BP: 151/96  Pulse: 79  Temp: 98.8 F (37.1 C)  Resp: 15    Physical Exam: Constitutional: Patient appears well-developed and well-nourished. No distress. HENT: Normocephalic, atraumatic, External right and left ear normal. Oropharynx is clear and moist.  Eyes: Conjunctivae and EOM are normal. PERRLA, no scleral icterus. Neck: Normal ROM. Neck supple. No JVD. No tracheal deviation. No thyromegaly. CVS: RRR, S1/S2 +, no murmurs, no gallops, no carotid bruit.  Pulmonary: Effort and breath sounds normal, no stridor, rhonchi, wheezes, rales.  Abdominal: Soft. BS +,  no distension, tenderness, rebound or guarding.  Musculoskeletal: Normal range of motion. No edema and no tenderness.  Lymphadenopathy: No lymphadenopathy noted, cervical, inguinal or axillary Neuro: Alert. Normal reflexes, muscle tone coordination. No cranial nerve deficit. Skin: Skin is warm and dry. Onychomycosis of toenails  Psychiatric: Normal mood and affect. Behavior, judgment, thought content normal.  No results found for: WBC, HGB, HCT, MCV, PLT Lab Results  Component Value Date   CREATININE 0.78 09/10/2015   BUN 12 09/10/2015   NA 142 09/10/2015   K 4.5 09/10/2015   CL 103 09/10/2015   CO2 31 09/10/2015    Lab Results  Component Value Date   HGBA1C 7.0 09/10/2015   Lipid Panel     Component Value Date/Time   CHOL 261* 09/10/2015 1252   TRIG 158* 09/10/2015 1252   HDL 49 09/10/2015 1252   CHOLHDL 5.3* 09/10/2015 1252   VLDL 32* 09/10/2015 1252   LDLCALC 180* 09/10/2015 1252       Assessment and plan:  1. Type 2 diabetes mellitus without complication, without long-term current use of insulin (HCC) Newly diagnosed with A1c of 7. Advised to  check blood sugars 3 times daily-before meals 2 hours postlunch and at bedtime. Advised to schedule annual eye exam; referred to podiatry in house for foot exam and onychomycosis - TRUEPLUS LANCETS 28G MISC; 1 each by Does not apply route 3 (three) times daily before meals.  Dispense: 100 each; Refill: 12 - Blood Glucose Monitoring Suppl (TRUE METRIX METER) DEVI; 1 each by Does not apply route 3 (three) times daily before meals.  Dispense: 1 Device; Refill: 0 - glucose blood (TRUE METRIX BLOOD GLUCOSE TEST) test strip; Use 3 times daily before meals  Dispense: 100 each; Refill: 12 - metFORMIN (GLUCOPHAGE) 500 MG tablet; Take 1 tablet (500 mg total) by mouth 2 (two) times daily with a meal.  Dispense: 60 tablet; Refill: 3  2. Essential hypertension Uncontrolled. Increased dose of lisinopril/HCTZ Low-sodium, DASH diet  3. Obesity She has been commended on weight loss of 4 lbs in the last 3 weeks Advised to increase physical activity, reduce portion sizes  4. Arthralgia Could be secondary to underlying osteoarthritis. Advised to use OTC ibuprofen.     5. Hyperlipidemia Yet to pickup Lipitor which I have advised her to do Low cholesterol diet, increase physical activity and lifestyle changes  Jaclyn Shaggy, MD.  [facility] [phone] 09/24/2015, 4:22 PM

## 2015-09-24 NOTE — Progress Notes (Signed)
Reports no issues Lost her atorvastatin and would like a paper script

## 2015-09-24 NOTE — Patient Instructions (Signed)
Discontinue lisinopril/HCTZ 10/12.5mg  and commenced 2 tablets of 20/12.5 daily.

## 2015-09-28 ENCOUNTER — Ambulatory Visit: Payer: Self-pay | Admitting: Pharmacist

## 2015-10-07 MED FILL — TRUE METRIX TEST STRIP: 33 days supply | Qty: 100 | Fill #1

## 2015-10-07 MED FILL — TRUEplus LANCETS 28G MISC: 33 days supply | Qty: 100 | Fill #1

## 2015-10-29 ENCOUNTER — Ambulatory Visit: Payer: Self-pay | Admitting: Family Medicine

## 2015-10-29 MED FILL — ATORVASTATIN 20 MG TABLET: 20 | 30 days supply | Qty: 30 | Fill #1

## 2015-10-29 MED FILL — LISINOPRIL-HCTZ 20-12.5 MG: 20-12.5 | 30 days supply | Qty: 60 | Fill #1

## 2015-11-24 ENCOUNTER — Encounter: Payer: Self-pay | Admitting: Family Medicine

## 2015-11-24 ENCOUNTER — Ambulatory Visit: Payer: Self-pay | Attending: Family Medicine | Admitting: Family Medicine

## 2015-11-24 VITALS — BP 130/80 | HR 80 | Temp 98.2°F | Resp 16 | Ht 63.5 in | Wt 231.6 lb

## 2015-11-24 DIAGNOSIS — Z7984 Long term (current) use of oral hypoglycemic drugs: Secondary | ICD-10-CM | POA: Insufficient documentation

## 2015-11-24 DIAGNOSIS — E785 Hyperlipidemia, unspecified: Secondary | ICD-10-CM | POA: Insufficient documentation

## 2015-11-24 DIAGNOSIS — I10 Essential (primary) hypertension: Secondary | ICD-10-CM | POA: Insufficient documentation

## 2015-11-24 DIAGNOSIS — Z79899 Other long term (current) drug therapy: Secondary | ICD-10-CM | POA: Insufficient documentation

## 2015-11-24 DIAGNOSIS — E119 Type 2 diabetes mellitus without complications: Secondary | ICD-10-CM | POA: Insufficient documentation

## 2015-11-24 DIAGNOSIS — M2550 Pain in unspecified joint: Secondary | ICD-10-CM | POA: Insufficient documentation

## 2015-11-24 DIAGNOSIS — Z7982 Long term (current) use of aspirin: Secondary | ICD-10-CM | POA: Insufficient documentation

## 2015-11-24 LAB — POCT GLYCOSYLATED HEMOGLOBIN (HGB A1C): HEMOGLOBIN A1C: 6.2

## 2015-11-24 LAB — GLUCOSE, POCT (MANUAL RESULT ENTRY): POC GLUCOSE: 105 mg/dL — AB (ref 70–99)

## 2015-11-24 MED ORDER — METFORMIN HCL 500 MG PO TABS: 500.0000 mg | ORAL_TABLET | Freq: Two times a day (BID) | ORAL | Status: DC

## 2015-11-24 MED ORDER — ATORVASTATIN CALCIUM 20 MG PO TABS: 20.0000 mg | ORAL_TABLET | Freq: Every day | ORAL | Status: DC

## 2015-11-24 MED ORDER — LISINOPRIL-HYDROCHLOROTHIAZIDE 20-12.5 MG PO TABS: 2.0000 | ORAL_TABLET | Freq: Every day | ORAL | Status: DC

## 2015-11-24 MED FILL — metFORMIN HCL 500 MG TABS: 500 | 30 days supply | Qty: 60 | Fill #0

## 2015-11-24 MED FILL — LISINOPRIL-HCTZ 20-12.5 MG: 20-12.5 | 30 days supply | Qty: 60 | Fill #0

## 2015-11-24 MED FILL — ?ATORVASTATIN 20 MG TABLET: 20 | 30 days supply | Qty: 30 | Fill #0

## 2015-11-24 NOTE — Progress Notes (Signed)
Subjective:  Patient ID: Paula Serrano, female    DOB: 05/05/1960  Age: 55 y.o. MRN: [id number]  CC: Hypertension; Diabetes; and Follow-up   HPI Paula HartsGladys Arkin with newly diagnosed type 2 diabetes mellitus (A1c 7.0), hypertension,Hyperlipidemia osteoarthritis who comes into the clinic for a follow-up visit.  She remains compliant with a diabetic diet, a prescribed exercise regimen, metformin, statin and has lost 17 pounds since her diagnosis 3 months ago. She last had an eye exam last year; denies numbness or tingling extremity and denies polyuria or hypoglycemia. Blood sugars are in the 130s and at other times in the 90s fasting. She will be returning to [place] and would like a 90 day supply of her medications.  Continues to have pain in her hips, knees and feet and waking up in the morning but pain resolves as the day progresses.    Outpatient Prescriptions Prior to Visit  Medication Sig Dispense Refill  . aspirin EC 81 MG tablet Take 81 mg by mouth daily.    . Blood Glucose Monitoring Suppl (TRUE METRIX METER) DEVI 1 each by Does not apply route 3 (three) times daily before meals. 1 Device 0  . calcium-vitamin D (OSCAL WITH D) 500-200 MG-UNIT tablet Take 1 tablet by mouth daily with breakfast.    . glucose blood (TRUE METRIX BLOOD GLUCOSE TEST) test strip Use 3 times daily before meals 100 each 12  . Multiple Vitamins-Minerals (MULTIVITAMIN & MINERAL PO) Take 1 tablet by mouth daily.    . Omega-3 Fatty Acids (FISH OIL) 1000 MG CAPS Take 1 capsule by mouth daily.    . TRUEPLUS LANCETS 28G MISC 1 each by Does not apply route 3 (three) times daily before meals. 100 each 12  . atorvastatin (LIPITOR) 20 MG tablet Take 1 tablet (20 mg total) by mouth daily. 30 tablet 3  . lisinopril-hydrochlorothiazide (ZESTORETIC) 20-12.5 MG tablet Take 2 tablets by mouth daily. 60 tablet 3  . metFORMIN (GLUCOPHAGE) 500 MG tablet Take 1 tablet (500 mg total) by mouth 2 (two) times daily with a meal. 60  tablet 3   No facility-administered medications prior to visit.    ROS Review of Systems  Constitutional: Negative for activity change, appetite change and fatigue.  HENT: Negative for congestion, sinus pressure and sore throat.   Eyes: Negative for visual disturbance.  Respiratory: Negative for cough, chest tightness, shortness of breath and wheezing.   Cardiovascular: Negative for chest pain and palpitations.  Gastrointestinal: Negative for abdominal pain, constipation and abdominal distention.  Endocrine: Negative for polydipsia.  Genitourinary: Negative for dysuria and frequency.  Musculoskeletal: Positive for arthralgias (joint pains in the hips, knees and feet). Negative for back pain.  Skin: Negative for rash.  Neurological: Negative for tremors, light-headedness and numbness.  Hematological: Does not bruise/bleed easily.  Psychiatric/Behavioral: Negative for behavioral problems and agitation.    Objective:  BP 145/82 mmHg  Pulse 80  Temp(Src) 98.2 F (36.8 C) (Oral)  Resp 16  Ht 5' 3.5" (1.613 m)  Wt 231 lb 9.6 oz (105.053 kg)  BMI 40.38 kg/m2  SpO2 99%  BP/Weight 11/24/2015 09/24/2015 09/21/2015  Systolic BP 145 151 137  Diastolic BP 82 96 91  Wt. (Lbs) 231.6 244 -  BMI 40.38 42.54 -      Physical Exam  Constitutional: She is oriented to person, place, and time. She appears well-developed and well-nourished.  Cardiovascular: Normal rate, normal heart sounds and intact distal pulses.   No murmur heard. Pulmonary/Chest: Effort normal and breath  sounds normal. She has no wheezes. She has no rales. She exhibits no tenderness.  Abdominal: Soft. Bowel sounds are normal. She exhibits no distension and no mass. There is no tenderness.  Musculoskeletal: Normal range of motion.  Neurological: She is alert and oriented to person, place, and time.     Lipid Panel     Component Value Date/Time   CHOL 261* 09/10/2015 1252   TRIG 158* 09/10/2015 1252   HDL 49 09/10/2015  1252   CHOLHDL 5.3* 09/10/2015 1252   VLDL 32* 09/10/2015 1252   LDLCALC 180* 09/10/2015 1252     Assessment & Plan:   1. Hyperlipidemia LDL is 180 which is greater than goal of less than 100 Continue low-cholesterol diet, lifestyle changes-Commended on weight loss Would love to repeat lipid panel in 1 month however the patient will be traveling out of the country and I have advised her to seek medical care for management of ongoing medical conditions. - atorvastatin (LIPITOR) 20 MG tablet; Take 1 tablet (20 mg total) by mouth daily.  Dispense: 90 tablet; Refill: 1  2. Essential hypertension Controlled - lisinopril-hydrochlorothiazide (ZESTORETIC) 20-12.5 MG tablet; Take 2 tablets by mouth daily.  Dispense: 180 tablet; Refill: 1  3. Type 2 diabetes mellitus without complication, without long-term current use of insulin (HCC) Significantly improved A1c of 6.2 which has trended down from 7 in 08/2015 Advised to schedule annual eye exam - POCT glucose (manual entry) - metFORMIN (GLUCOPHAGE) 500 MG tablet; Take 1 tablet (500 mg total) by mouth 2 (two) times daily with a meal.  Dispense: 180 tablet; Refill: 1 - HgB A1c  4. Arthralgia Patient will use OTC NSAIDs for symptom management   Meds ordered this encounter  Medications  . lisinopril-hydrochlorothiazide (ZESTORETIC) 20-12.5 MG tablet    Sig: Take 2 tablets by mouth daily.    Dispense:  180 tablet    Refill:  1    Discontinue previous dose  . metFORMIN (GLUCOPHAGE) 500 MG tablet    Sig: Take 1 tablet (500 mg total) by mouth 2 (two) times daily with a meal.    Dispense:  180 tablet    Refill:  1  . atorvastatin (LIPITOR) 20 MG tablet    Sig: Take 1 tablet (20 mg total) by mouth daily.    Dispense:  90 tablet    Refill:  1    Follow-up: Return in about 6 months (around 05/26/2016) for Follow-up of diabetes mellitus.   Jaclyn Shaggy MD

## 2015-11-24 NOTE — Progress Notes (Signed)
Patient's here for f/up DM, HTN.  Patient requesting med refill.

## 2015-11-29 MED FILL — TRUE METRIX TEST STRIP: 33 days supply | Qty: 100 | Fill #2

## 2015-11-29 MED FILL — TRUEplus LANCETS 28G MISC: 33 days supply | Qty: 100 | Fill #2

## 2015-12-21 MED FILL — metFORMIN HCL 500 MG TABS: 500 | 90 days supply | Qty: 180 | Fill #1

## 2015-12-21 MED FILL — TRUEplus LANCETS 28G MISC: 90 days supply | Qty: 300 | Fill #3

## 2015-12-21 MED FILL — TRUE METRIX TEST STRIP: 90 days supply | Qty: 300 | Fill #3

## 2015-12-21 MED FILL — LISINOPRIL-HCTZ 20-12.5 MG: 20-12.5 | 90 days supply | Qty: 180 | Fill #1

## 2015-12-21 MED FILL — ATORVASTATIN 20 MG TABLET: 20 | 90 days supply | Qty: 90 | Fill #1

## 2016-06-26 ENCOUNTER — Ambulatory Visit: Payer: Self-pay | Attending: Family Medicine | Admitting: Family Medicine

## 2016-06-26 ENCOUNTER — Encounter: Payer: Self-pay | Admitting: Family Medicine

## 2016-06-26 VITALS — BP 181/103 | HR 65 | Temp 98.1°F | Ht 64.0 in | Wt 206.4 lb

## 2016-06-26 DIAGNOSIS — E119 Type 2 diabetes mellitus without complications: Secondary | ICD-10-CM

## 2016-06-26 DIAGNOSIS — Z7984 Long term (current) use of oral hypoglycemic drugs: Secondary | ICD-10-CM | POA: Insufficient documentation

## 2016-06-26 DIAGNOSIS — Z23 Encounter for immunization: Secondary | ICD-10-CM

## 2016-06-26 DIAGNOSIS — Z1159 Encounter for screening for other viral diseases: Secondary | ICD-10-CM

## 2016-06-26 DIAGNOSIS — Z7982 Long term (current) use of aspirin: Secondary | ICD-10-CM | POA: Insufficient documentation

## 2016-06-26 DIAGNOSIS — I10 Essential (primary) hypertension: Secondary | ICD-10-CM

## 2016-06-26 DIAGNOSIS — E7800 Pure hypercholesterolemia, unspecified: Secondary | ICD-10-CM

## 2016-06-26 LAB — POCT GLYCOSYLATED HEMOGLOBIN (HGB A1C): HEMOGLOBIN A1C: 5.7

## 2016-06-26 LAB — GLUCOSE, POCT (MANUAL RESULT ENTRY): POC GLUCOSE: 134 mg/dL — AB (ref 70–99)

## 2016-06-26 MED ORDER — ATORVASTATIN CALCIUM 20 MG PO TABS: 20.0000 mg | ORAL_TABLET | Freq: Every day | ORAL | 1 refills | Status: DC

## 2016-06-26 MED ORDER — LISINOPRIL-HYDROCHLOROTHIAZIDE 20-12.5 MG PO TABS: 2.0000 | ORAL_TABLET | Freq: Every day | ORAL | 1 refills | Status: DC

## 2016-06-26 MED ORDER — METFORMIN HCL 500 MG PO TABS: 500.0000 mg | ORAL_TABLET | Freq: Two times a day (BID) | ORAL | 1 refills | Status: DC

## 2016-06-26 MED FILL — LISINOPRIL-HCTZ 20-12.5 MG: 20-12.5 | 30 days supply | Qty: 60 | Fill #0

## 2016-06-26 MED FILL — ATORVASTATIN 20 MG TABLET: 20 | 30 days supply | Qty: 30 | Fill #0

## 2016-06-26 MED FILL — metFORMIN HCL 500 MG TABS: 500 | 30 days supply | Qty: 60 | Fill #0

## 2016-06-26 NOTE — Patient Instructions (Signed)
Hypertension Hypertension, commonly called high blood pressure, is when the force of blood pumping through your arteries is too strong. Your arteries are the blood vessels that carry blood from your heart throughout your body. A blood pressure reading consists of a higher number over a lower number, such as 110/72. The higher number (systolic) is the pressure inside your arteries when your heart pumps. The lower number (diastolic) is the pressure inside your arteries when your heart relaxes. Ideally you want your blood pressure below 120/80. Hypertension forces your heart to work harder to pump blood. Your arteries may become narrow or stiff. Having untreated or uncontrolled hypertension can cause heart attack, stroke, kidney disease, and other problems. What increases the risk? Some risk factors for high blood pressure are controllable. Others are not. Risk factors you cannot control include:  Race. You may be at higher risk if you are African American.  Age. Risk increases with age.  Gender. Men are at higher risk than women before age 45 years. After age 65, women are at higher risk than men. Risk factors you can control include:  Not getting enough exercise or physical activity.  Being overweight.  Getting too much fat, sugar, calories, or salt in your diet.  Drinking too much alcohol. What are the signs or symptoms? Hypertension does not usually cause signs or symptoms. Extremely high blood pressure (hypertensive crisis) may cause headache, anxiety, shortness of breath, and nosebleed. How is this diagnosed? To check if you have hypertension, your health care provider will measure your blood pressure while you are seated, with your arm held at the level of your heart. It should be measured at least twice using the same arm. Certain conditions can cause a difference in blood pressure between your right and left arms. A blood pressure reading that is higher than normal on one occasion does  not mean that you need treatment. If it is not clear whether you have high blood pressure, you may be asked to return on a different day to have your blood pressure checked again. Or, you may be asked to monitor your blood pressure at home for 1 or more weeks. How is this treated? Treating high blood pressure includes making lifestyle changes and possibly taking medicine. Living a healthy lifestyle can help lower high blood pressure. You may need to change some of your habits. Lifestyle changes may include:  Following the DASH diet. This diet is high in fruits, vegetables, and whole grains. It is low in salt, red meat, and added sugars.  Keep your sodium intake below 2,300 mg per day.  Getting at least 30-45 minutes of aerobic exercise at least 4 times per week.  Losing weight if necessary.  Not smoking.  Limiting alcoholic beverages.  Learning ways to reduce stress. Your health care provider may prescribe medicine if lifestyle changes are not enough to get your blood pressure under control, and if one of the following is true:  You are 18-59 years of age and your systolic blood pressure is above 140.  You are 60 years of age or older, and your systolic blood pressure is above 150.  Your diastolic blood pressure is above 90.  You have diabetes, and your systolic blood pressure is over 140 or your diastolic blood pressure is over 90.  You have kidney disease and your blood pressure is above 140/90.  You have heart disease and your blood pressure is above 140/90. Your personal target blood pressure may vary depending on your medical   conditions, your age, and other factors. Follow these instructions at home:  Have your blood pressure rechecked as directed by your health care provider.  Take medicines only as directed by your health care provider. Follow the directions carefully. Blood pressure medicines must be taken as prescribed. The medicine does not work as well when you skip  doses. Skipping doses also puts you at risk for problems.  Do not smoke.  Monitor your blood pressure at home as directed by your health care provider. Contact a health care provider if:  You think you are having a reaction to medicines taken.  You have recurrent headaches or feel dizzy.  You have swelling in your ankles.  You have trouble with your vision. Get help right away if:  You develop a severe headache or confusion.  You have unusual weakness, numbness, or feel faint.  You have severe chest or abdominal pain.  You vomit repeatedly.  You have trouble breathing. This information is not intended to replace advice given to you by your health care provider. Make sure you discuss any questions you have with your health care provider. Document Released: 07/10/2005 Document Revised: 12/16/2015 Document Reviewed: 05/02/2013 Elsevier Interactive Patient Education  2017 [employer] Inc[employer]  

## 2016-06-26 NOTE — Progress Notes (Signed)
Subjective:  Patient ID: Paula Serrano, female    DOB: 05/05/1960  Age: 55 y.o. MRN: [id number]  CC: Follow-up visit  HPI Paula Serrano is a 55 year old female with a history of type 2 diabetes mellitus (A1c 5.7), hypertension, hyperlipidemia who presents today for follow-up visit.  She has been away in [place] for the last 6 months and returned 1 week ago. Endorses running out of her antihypertensives hence elevated blood pressure.  Compliant with her metformin, has been exercising and adhering to a diabetic diet. Denies visual complaints, numbness in extremities or hypoglycemia.  Tolerating her statin and denies problems with myopathy.  Past Medical History:  Diagnosis Date  . Asthma   . Hypertension     History reviewed. No pertinent surgical history.  No Known Allergies   Outpatient Medications Prior to Visit  Medication Sig Dispense Refill  . aspirin EC 81 MG tablet Take 81 mg by mouth daily.    . Blood Glucose Monitoring Suppl (TRUE METRIX METER) DEVI 1 each by Does not apply route 3 (three) times daily before meals. 1 Device 0  . calcium-vitamin D (OSCAL WITH D) 500-200 MG-UNIT tablet Take 1 tablet by mouth daily with breakfast.    . glucose blood (TRUE METRIX BLOOD GLUCOSE TEST) test strip Use 3 times daily before meals 100 each 12  . Multiple Vitamins-Minerals (MULTIVITAMIN & MINERAL PO) Take 1 tablet by mouth daily.    . Omega-3 Fatty Acids (FISH OIL) 1000 MG CAPS Take 1 capsule by mouth daily.    . TRUEPLUS LANCETS 28G MISC 1 each by Does not apply route 3 (three) times daily before meals. 100 each 12  . atorvastatin (LIPITOR) 20 MG tablet Take 1 tablet (20 mg total) by mouth daily. 90 tablet 1  . lisinopril-hydrochlorothiazide (ZESTORETIC) 20-12.5 MG tablet Take 2 tablets by mouth daily. 180 tablet 1  . metFORMIN (GLUCOPHAGE) 500 MG tablet Take 1 tablet (500 mg total) by mouth 2 (two) times daily with a meal. 180 tablet 1   No facility-administered medications  prior to visit.     ROS Review of Systems  Constitutional: Negative for activity change, appetite change and fatigue.  HENT: Negative for congestion, sinus pressure and sore throat.   Eyes: Negative for visual disturbance.  Respiratory: Negative for cough, chest tightness, shortness of breath and wheezing.   Cardiovascular: Negative for chest pain and palpitations.  Gastrointestinal: Negative for abdominal distention, abdominal pain and constipation.  Endocrine: Negative for polydipsia.  Genitourinary: Negative for dysuria and frequency.  Musculoskeletal: Negative for arthralgias and back pain.  Skin: Negative for rash.  Neurological: Negative for tremors, light-headedness and numbness.  Hematological: Does not bruise/bleed easily.  Psychiatric/Behavioral: Negative for agitation and behavioral problems.    Objective:  BP (!) 181/103 (BP Location: Right Arm, Patient Position: Sitting, Cuff Size: Small)   Pulse 65   Temp 98.1 F (36.7 C) (Oral)   Ht 5\' 4"  (1.626 m)   Wt 206 lb 6.4 oz (93.6 kg)   SpO2 100%   BMI 35.43 kg/m   BP/Weight 06/26/2016 11/24/2015 09/24/2015  Systolic BP 181 130 151  Diastolic BP 103 80 96  Wt. (Lbs) 206.4 231.6 244  BMI 35.43 40.38 42.54      Physical Exam  Constitutional: She is oriented to person, place, and time. She appears well-developed and well-nourished.  Cardiovascular: Normal rate, normal heart sounds and intact distal pulses.   No murmur heard. Pulmonary/Chest: Effort normal and breath sounds normal. She has no wheezes. She  has no rales. She exhibits no tenderness.  Abdominal: Soft. Bowel sounds are normal. She exhibits no distension and no mass. There is no tenderness.  Musculoskeletal: Normal range of motion.  Neurological: She is alert and oriented to person, place, and time.     Lab Results  Component Value Date   HGBA1C 5.7 06/26/2016    Lipid Panel     Component Value Date/Time   CHOL 261 (H) 09/10/2015 1252   TRIG 158  (H) 09/10/2015 1252   HDL 49 09/10/2015 1252   CHOLHDL 5.3 (H) 09/10/2015 1252   VLDL 32 (H) 09/10/2015 1252   LDLCALC 180 (H) 09/10/2015 1252    CMP Latest Ref Rng & Units 09/10/2015 08/28/2015  Glucose 65 - 99 mg/dL 91 161(W124(H)  BUN 7 - 25 mg/dL 12 10  Creatinine 9.600.50 - 1.05 mg/dL 4.540.78 0.980.82  Sodium 119135 - 146 mmol/L 142 140  Potassium 3.5 - 5.3 mmol/L 4.5 3.7  Chloride 98 - 110 mmol/L 103 101  CO2 20 - 31 mmol/L 31 28  Calcium 8.6 - 10.4 mg/dL 9.7 9.6  Total Protein 6.1 - 8.1 g/dL 7.3 -  Total Bilirubin 0.2 - 1.2 mg/dL 0.7 -  Alkaline Phos 33 - 130 U/L 65 -  AST 10 - 35 U/L 17 -  ALT 6 - 29 U/L 15 -       Assessment & Plan:   1. Type 2 diabetes mellitus without complication, without long-term current use of insulin (HCC) Controlled with A1c of 5.7 Continue diabetic diet Advised to schedule annual eye exam - Glucose (CBG) - HgB A1c - metFORMIN (GLUCOPHAGE) 500 MG tablet; Take 1 tablet (500 mg total) by mouth 2 (two) times daily with a meal.  Dispense: 180 tablet; Refill: 1  2. Essential hypertension Uncontrolled due to running out of her antihypertensives - lisinopril-hydrochlorothiazide (ZESTORETIC) 20-12.5 MG tablet; Take 2 tablets by mouth daily.  Dispense: 180 tablet; Refill: 1 - COMPLETE METABOLIC PANEL WITH GFR; Future  3. Pure hypercholesterolemia Uncontrolled Low-cholesterol diet, lifestyle modification Will send off lipid panel to assess for effectiveness of Lipitor - atorvastatin (LIPITOR) 20 MG tablet; Take 1 tablet (20 mg total) by mouth daily.  Dispense: 90 tablet; Refill: 1 - Lipid panel; Future  4. Screening for viral disease - Hepatitis C antibody, reflex; Future   Meds ordered this encounter  Medications  . metFORMIN (GLUCOPHAGE) 500 MG tablet    Sig: Take 1 tablet (500 mg total) by mouth 2 (two) times daily with a meal.    Dispense:  180 tablet    Refill:  1  . lisinopril-hydrochlorothiazide (ZESTORETIC) 20-12.5 MG tablet    Sig: Take 2 tablets  by mouth daily.    Dispense:  180 tablet    Refill:  1  . atorvastatin (LIPITOR) 20 MG tablet    Sig: Take 1 tablet (20 mg total) by mouth daily.    Dispense:  90 tablet    Refill:  1    Follow-up: Return in about 1 week (around 07/03/2016) for Follow-up on hypertension.   Jaclyn ShaggyEnobong Amao MD

## 2016-06-27 ENCOUNTER — Ambulatory Visit: Payer: Self-pay | Attending: Family Medicine

## 2016-06-27 DIAGNOSIS — E7800 Pure hypercholesterolemia, unspecified: Secondary | ICD-10-CM | POA: Insufficient documentation

## 2016-06-27 DIAGNOSIS — Z1159 Encounter for screening for other viral diseases: Secondary | ICD-10-CM | POA: Insufficient documentation

## 2016-06-27 DIAGNOSIS — I10 Essential (primary) hypertension: Secondary | ICD-10-CM | POA: Insufficient documentation

## 2016-06-27 LAB — COMPLETE METABOLIC PANEL WITH GFR
ALT: 10 U/L (ref 6–29)
AST: 13 U/L (ref 10–35)
Albumin: 4.3 g/dL (ref 3.6–5.1)
Alkaline Phosphatase: 59 U/L (ref 33–130)
BUN: 16 mg/dL (ref 7–25)
CALCIUM: 9.7 mg/dL (ref 8.6–10.4)
CHLORIDE: 102 mmol/L (ref 98–110)
CO2: 30 mmol/L (ref 20–31)
CREATININE: 0.9 mg/dL (ref 0.50–1.05)
GFR, Est African American: 82 mL/min (ref >=60)
GFR, Est Non African American: 71 mL/min (ref >=60)
GLUCOSE: 85 mg/dL (ref 65–99)
Potassium: 4.2 mmol/L (ref 3.5–5.3)
Sodium: 139 mmol/L (ref 135–146)
Total Bilirubin: 0.8 mg/dL (ref 0.2–1.2)
Total Protein: 7.3 g/dL (ref 6.1–8.1)

## 2016-06-27 LAB — LIPID PANEL
CHOL/HDL RATIO: 4.7 ratio (ref <5.0)
Cholesterol: 231 mg/dL — ABNORMAL HIGH (ref <200)
HDL: 49 mg/dL — AB (ref >50)
LDL CALC: 142 mg/dL — AB (ref <100)
Triglycerides: 200 mg/dL — ABNORMAL HIGH (ref <150)
VLDL: 40 mg/dL — ABNORMAL HIGH (ref <30)

## 2016-06-27 NOTE — Progress Notes (Signed)
Patient here for lab visit only 

## 2016-06-28 ENCOUNTER — Other Ambulatory Visit: Payer: Self-pay | Admitting: Family Medicine

## 2016-06-28 DIAGNOSIS — E7800 Pure hypercholesterolemia, unspecified: Secondary | ICD-10-CM

## 2016-06-28 LAB — HEPATITIS C ANTIBODY: HCV AB: NEGATIVE

## 2016-06-28 MED ORDER — ATORVASTATIN CALCIUM 40 MG PO TABS: 40.0000 mg | ORAL_TABLET | Freq: Every day | ORAL | 1 refills | Status: DC

## 2016-07-05 ENCOUNTER — Ambulatory Visit: Payer: Self-pay | Attending: Family Medicine | Admitting: Family Medicine

## 2016-07-05 ENCOUNTER — Encounter: Payer: Self-pay | Admitting: Family Medicine

## 2016-07-05 VITALS — BP 120/80 | HR 66 | Temp 98.5°F | Resp 20 | Ht 64.0 in | Wt 201.0 lb

## 2016-07-05 DIAGNOSIS — Z7984 Long term (current) use of oral hypoglycemic drugs: Secondary | ICD-10-CM | POA: Insufficient documentation

## 2016-07-05 DIAGNOSIS — Z79899 Other long term (current) drug therapy: Secondary | ICD-10-CM | POA: Insufficient documentation

## 2016-07-05 DIAGNOSIS — Z7982 Long term (current) use of aspirin: Secondary | ICD-10-CM | POA: Insufficient documentation

## 2016-07-05 DIAGNOSIS — E7800 Pure hypercholesterolemia, unspecified: Secondary | ICD-10-CM | POA: Insufficient documentation

## 2016-07-05 DIAGNOSIS — E119 Type 2 diabetes mellitus without complications: Secondary | ICD-10-CM | POA: Insufficient documentation

## 2016-07-05 DIAGNOSIS — I10 Essential (primary) hypertension: Secondary | ICD-10-CM | POA: Insufficient documentation

## 2016-07-05 LAB — GLUCOSE, POCT (MANUAL RESULT ENTRY): POC GLUCOSE: 79 mg/dL (ref 70–99)

## 2016-07-05 MED FILL — LISINOPRIL-HCTZ 20-12.5 MG: 20-12.5 | 60 days supply | Qty: 120 | Fill #1

## 2016-07-05 MED FILL — metFORMIN HCL 500 MG TABS: 500 | 60 days supply | Qty: 120 | Fill #1

## 2016-07-05 NOTE — Progress Notes (Signed)
Subjective:  Patient ID: Paula Serrano, female    DOB: 03/27/1960  Age: 55 y.o. MRN: [id number]  CC: Hypertension   HPI Paula Serrano is a 55 year old female with a history of type 2 diabetes mellitus (A1c 5.7), hypertension, hyperlipidemia who presents today for follow up on her hypertension as her blood pressure had been severely elevated at her last office visit due to running out of medications.  Blood pressure is normal today. Results of labs revealed elevated lipids for which her atorvastatin had been increased from 20 mg to 40 mg. She continues to adhere to low-sodium, low-cholesterol, diabetic diet and exercises regularly.   Past Medical History:  Diagnosis Date  . Asthma   . Hypertension     No past surgical history on file.  No Known Allergies   Outpatient Medications Prior to Visit  Medication Sig Dispense Refill  . aspirin EC 81 MG tablet Take 81 mg by mouth daily.    Marland Kitchen. atorvastatin (LIPITOR) 40 MG tablet Take 1 tablet (40 mg total) by mouth daily. 90 tablet 1  . calcium-vitamin D (OSCAL WITH D) 500-200 MG-UNIT tablet Take 1 tablet by mouth daily with breakfast.    . lisinopril-hydrochlorothiazide (ZESTORETIC) 20-12.5 MG tablet Take 2 tablets by mouth daily. 180 tablet 1  . metFORMIN (GLUCOPHAGE) 500 MG tablet Take 1 tablet (500 mg total) by mouth 2 (two) times daily with a meal. 180 tablet 1  . Multiple Vitamins-Minerals (MULTIVITAMIN & MINERAL PO) Take 1 tablet by mouth daily.    . Omega-3 Fatty Acids (FISH OIL) 1000 MG CAPS Take 1 capsule by mouth daily.    . Blood Glucose Monitoring Suppl (TRUE METRIX METER) DEVI 1 each by Does not apply route 3 (three) times daily before meals. 1 Device 0  . glucose blood (TRUE METRIX BLOOD GLUCOSE TEST) test strip Use 3 times daily before meals 100 each 12  . TRUEPLUS LANCETS 28G MISC 1 each by Does not apply route 3 (three) times daily before meals. 100 each 12   No facility-administered medications prior to visit.      ROS Review of Systems  Constitutional: Negative for activity change and appetite change.  HENT: Negative for sinus pressure and sore throat.   Respiratory: Negative for chest tightness, shortness of breath and wheezing.   Cardiovascular: Negative for chest pain and palpitations.  Gastrointestinal: Negative for abdominal distention, abdominal pain and constipation.  Genitourinary: Negative.   Musculoskeletal: Negative.   Psychiatric/Behavioral: Negative for behavioral problems and dysphoric mood.    Objective:  BP 120/80   Pulse 66   Temp 98.5 F (36.9 C) (Oral)   Resp 20   Ht 5\' 4"  (1.626 m)   Wt 201 lb (91.2 kg)   SpO2 100%   BMI 34.50 kg/m   BP/Weight 07/05/2016 06/26/2016 11/24/2015  Systolic BP 120 181 130  Diastolic BP 80 103 80  Wt. (Lbs) 201 206.4 231.6  BMI 34.5 35.43 40.38      Physical Exam  Constitutional: She is oriented to person, place, and time. She appears well-developed and well-nourished.  Cardiovascular: Normal rate, normal heart sounds and intact distal pulses.   No murmur heard. Pulmonary/Chest: Effort normal and breath sounds normal. She has no wheezes. She has no rales. She exhibits no tenderness.  Abdominal: Soft. Bowel sounds are normal. She exhibits no distension and no mass. There is no tenderness.  Musculoskeletal: Normal range of motion.  Neurological: She is alert and oriented to person, place, and time.  Skin: Skin  is warm and dry.  Psychiatric: She has a normal mood and affect.     Lab Results  Component Value Date   HGBA1C 5.7 06/26/2016   Lipid Panel     Component Value Date/Time   CHOL 231 (H) 06/27/2016 0918   TRIG 200 (H) 06/27/2016 0918   HDL 49 (L) 06/27/2016 0918   CHOLHDL 4.7 06/27/2016 0918   VLDL 40 (H) 06/27/2016 0918   LDLCALC 142 (H) 06/27/2016 0918     Assessment & Plan:   1. Type 2 diabetes mellitus without complication, without long-term current use of insulin (HCC) Controlled with A1c of  5.7 Continue medications, diabetic diet - POCT glucose (manual entry)  2. Essential hypertension Controlled Low-sodium diet, lifestyle modifications  3. Pure hypercholesterolemia Uncontrolled Atorvastatin had been increased to 40 mg Low-cholesterol diet   No orders of the defined types were placed in this encounter.   Follow-up: Return in about 3 months (around 10/03/2016) for Follow-up on diabetes mellitus.   Jaclyn ShaggyEnobong Amao MD

## 2016-07-05 NOTE — Patient Instructions (Signed)
Dyslipidemia Dyslipidemia is an imbalance of waxy, fat-like substances (lipids) in the blood. The body needs lipids in small amounts. Dyslipidemia often involves a high level of cholesterol or triglycerides, which are types of lipids. Common forms of dyslipidemia include:  High levels of bad cholesterol (LDL cholesterol). LDL is the type of cholesterol that causes fatty deposits (plaques) to build up in the blood vessels that carry blood away from your heart (arteries).  Low levels of good cholesterol (HDL cholesterol). HDL cholesterol is the type of cholesterol that protects against heart disease. High levels of HDL remove the LDL buildup from arteries.  High levels of triglycerides. Triglycerides are a fatty substance in the blood that is linked to a buildup of plaques in the arteries. You can develop dyslipidemia because of the genes you are born with (primary dyslipidemia) or changes that occur during your life (secondary dyslipidemia), or as a side effect of certain medical treatments. What are the causes? Primary dyslipidemia is caused by changes (mutations) in genes that are passed down through families (inherited). These mutations cause several types of dyslipidemia. Mutations can result in disorders that make the body produce too much LDL cholesterol or triglycerides, or not enough HDL cholesterol. These disorders may lead to heart disease, arterial disease, or stroke at an early age. Causes of secondary dyslipidemia include certain lifestyle choices and diseases that lead to dyslipidemia, such as:  Eating a diet that is high in animal fat.  Not getting enough activity or exercise (having a sedentary lifestyle).  Having diabetes, kidney disease, liver disease, or thyroid disease.  Drinking large amounts of alcohol.  Using certain types of drugs. What increases the risk? You may be at greater risk for dyslipidemia if you are an older man or if you are a woman who has gone through  menopause. Other risk factors include:  Having a family history of dyslipidemia.  Taking certain medicines, including birth control pills, steroids, some diuretics, beta-blockers, and some medicines forHIV.  Smoking cigarettes.  Eating a high-fat diet.  Drinking large amounts of alcohol.  Having certain medical conditions such as diabetes, polycystic ovary syndrome (PCOS), pregnancy, kidney disease, liver disease, or hypothyroidism.  Not exercising regularly.  Being overweight or obese with too much belly fat. What are the signs or symptoms? Dyslipidemia does not usually cause any symptoms. Very high lipid levels can cause fatty bumps under the skin (xanthomas) or a white or gray ring around the black center (pupil) of the eye. Very high triglyceride levels can cause inflammation of the pancreas (pancreatitis). How is this diagnosed? Your health care provider may diagnose dyslipidemia based on a routine blood test (fasting blood test). Because most people do not have symptoms of the condition, this blood testing (lipid profile) is done on adults age 20 and older and is repeated every 5 years. This test checks:  Total cholesterol. This is a measure of the total amount of cholesterol in your blood, including LDL cholesterol, HDL cholesterol, and triglycerides. A healthy number is below 200.  LDL cholesterol. The target number for LDL cholesterol is different for each person, depending on individual risk factors. For most people, a number below 100 is healthy. Ask your health care provider what your LDL cholesterol number should be.  HDL cholesterol. An HDL level of 60 or higher is best because it helps to protect against heart disease. A number below 40 for men or below 50 for women increases the risk for heart disease.  Triglycerides. A healthy triglyceride number   is below 150. If your lipid profile is abnormal, your health care provider may do other blood tests to get more information  about your condition. How is this treated? Treatment depends on the type of dyslipidemia that you have and your other risk factors for heart disease and stroke. Your health care provider will have a target range for your lipid levels based on this information. For many people, treatment starts with lifestyle changes, such as diet and exercise. Your health care provider may recommend that you:  Get regular exercise.  Make changes to your diet.  Quit smoking if you smoke. If diet changes and exercise do not help you reach your goals, your health care provider may also prescribe medicine to lower lipids. The most commonly prescribed type of medicine lowers your LDL cholesterol (statin drug). If you have a high triglyceride level, your provider may prescribe another type of drug (fibrate) or an omega-3 fish oil supplement, or both. Follow these instructions at home:  Take over-the-counter and prescription medicines only as told by your health care provider. This includes supplements.  Get regular exercise. Start an aerobic exercise and strength training program as told by your health care provider. Ask your health care provider what activities are safe for you. Your health care provider may recommend:  30 minutes of aerobic activity 4-6 days a week. Brisk walking is an example of aerobic activity.  Strength training 2 days a week.  Eat a healthy diet as told by your health care provider. This can help you reach and maintain a healthy weight, lower your LDL cholesterol, and raise your HDL cholesterol. It may help to work with a diet and nutrition specialist (dietitian) to make a plan that is right for you. Your dietitian or health care provider may recommend:  Limiting your calories, if you are overweight.  Eating more fruits, vegetables, whole grains, fish, and lean meats.  Limiting saturated fat, trans fat, and cholesterol.  Follow instructions from your health care provider or dietitian  about eating or drinking restrictions.  Limit alcohol intake to no more than one drink per day for nonpregnant women and two drinks per day for men. One drink equals 12 oz of beer, 5 oz of wine, or 1 oz of hard liquor.  Do not use any products that contain nicotine or tobacco, such as cigarettes and e-cigarettes. If you need help quitting, ask your health care provider.  Keep all follow-up visits as told by your health care provider. This is important. Contact a health care provider if:  You are having trouble sticking to your exercise or diet plan.  You are struggling to quit smoking or control your use of alcohol. Summary  Dyslipidemia is an imbalance of waxy, fat-like substances (lipids) in the blood. The body needs lipids in small amounts. Dyslipidemia often involves a high level of cholesterol or triglycerides, which are types of lipids.  Treatment depends on the type of dyslipidemia that you have and your other risk factors for heart disease and stroke.  For many people, treatment starts with lifestyle changes, such as diet and exercise. Your health care provider may also prescribe medicine to lower lipids. This information is not intended to replace advice given to you by your health care provider. Make sure you discuss any questions you have with your health care provider. Document Released: 07/15/2013 Document Revised: 03/06/2016 Document Reviewed: 03/06/2016 Elsevier Interactive Patient Education  2017 [employer]  

## 2016-07-12 MED FILL — ATORVASTATIN 40 MG TABLET: 40 | 60 days supply | Qty: 60 | Fill #0

## 2016-10-04 ENCOUNTER — Encounter: Payer: Self-pay | Admitting: Family Medicine

## 2016-10-04 ENCOUNTER — Ambulatory Visit: Payer: Self-pay | Attending: Family Medicine | Admitting: Family Medicine

## 2016-10-04 VITALS — BP 125/76 | HR 69 | Temp 98.1°F | Ht 64.0 in | Wt 213.2 lb

## 2016-10-04 DIAGNOSIS — I10 Essential (primary) hypertension: Secondary | ICD-10-CM | POA: Insufficient documentation

## 2016-10-04 DIAGNOSIS — E1169 Type 2 diabetes mellitus with other specified complication: Secondary | ICD-10-CM | POA: Insufficient documentation

## 2016-10-04 DIAGNOSIS — Z7984 Long term (current) use of oral hypoglycemic drugs: Secondary | ICD-10-CM | POA: Insufficient documentation

## 2016-10-04 DIAGNOSIS — Z7982 Long term (current) use of aspirin: Secondary | ICD-10-CM | POA: Insufficient documentation

## 2016-10-04 DIAGNOSIS — E119 Type 2 diabetes mellitus without complications: Secondary | ICD-10-CM

## 2016-10-04 DIAGNOSIS — E6609 Other obesity due to excess calories: Secondary | ICD-10-CM

## 2016-10-04 DIAGNOSIS — E669 Obesity, unspecified: Secondary | ICD-10-CM | POA: Insufficient documentation

## 2016-10-04 DIAGNOSIS — E7800 Pure hypercholesterolemia, unspecified: Secondary | ICD-10-CM | POA: Insufficient documentation

## 2016-10-04 DIAGNOSIS — Z6836 Body mass index (BMI) 36.0-36.9, adult: Secondary | ICD-10-CM | POA: Insufficient documentation

## 2016-10-04 LAB — GLUCOSE, POCT (MANUAL RESULT ENTRY): POC GLUCOSE: 132 mg/dL — AB (ref 70–99)

## 2016-10-04 LAB — POCT GLYCOSYLATED HEMOGLOBIN (HGB A1C): HEMOGLOBIN A1C: 5.7

## 2016-10-04 MED ORDER — ATORVASTATIN CALCIUM 40 MG PO TABS: 40.0000 mg | ORAL_TABLET | Freq: Every day | ORAL | 1 refills | Status: DC

## 2016-10-04 MED ORDER — LISINOPRIL-HYDROCHLOROTHIAZIDE 20-12.5 MG PO TABS: 2.0000 | ORAL_TABLET | Freq: Every day | ORAL | 1 refills | Status: DC

## 2016-10-04 MED ORDER — METFORMIN HCL 500 MG PO TABS: 500.0000 mg | ORAL_TABLET | Freq: Two times a day (BID) | ORAL | 1 refills | Status: DC

## 2016-10-04 MED FILL — LISINOPRIL-HCTZ 20-12.5 MG: 20-12.5 | 90 days supply | Qty: 180 | Fill #0

## 2016-10-04 MED FILL — metFORMIN HCL 500 MG TABS: 500 | 90 days supply | Qty: 180 | Fill #0

## 2016-10-04 MED FILL — ATORVASTATIN 40 MG TABLET: 40 | 90 days supply | Qty: 90 | Fill #0

## 2016-10-04 NOTE — Progress Notes (Signed)
Refill meds

## 2016-10-04 NOTE — Patient Instructions (Signed)

## 2016-10-04 NOTE — Progress Notes (Signed)
Subjective:  Patient ID: Paula Serrano, female    DOB: 02/27/1961  Age: 56 y.o. MRN: [id number]  CC: Diabetes; Hyperlipidemia; and Hypertension   HPI Scarlet Abad is a 56 year old female with a history of type 2 diabetes mellitus (A1c 5.7), hypertension, hyperlipidemia, Obesity who presents today for a follow-up visit.  She endorses compliance with her medications and denies hypoglycemia or numbness in extremities. She is yet to see an ophthalmologist as she is without medical coverage and in search of cheaper options. Compliant with her low-cholesterol, low-sodium, diabetic diet.  She gained 12 pounds in the last 3 months attributed to reduced exercise due to the cold and having to babysit her 71-month-old grandsons. She has no acute concerns today.  Past Medical History:  Diagnosis Date  . Asthma   . Hypertension     History reviewed. No pertinent surgical history.  No Known Allergies   Outpatient Medications Prior to Visit  Medication Sig Dispense Refill  . aspirin EC 81 MG tablet Take 81 mg by mouth daily.    . Blood Glucose Monitoring Suppl (TRUE METRIX METER) DEVI 1 each by Does not apply route 3 (three) times daily before meals. 1 Device 0  . calcium-vitamin D (OSCAL WITH D) 500-200 MG-UNIT tablet Take 1 tablet by mouth daily with breakfast.    . glucose blood (TRUE METRIX BLOOD GLUCOSE TEST) test strip Use 3 times daily before meals 100 each 12  . Multiple Vitamins-Minerals (MULTIVITAMIN & MINERAL PO) Take 1 tablet by mouth daily.    . Omega-3 Fatty Acids (FISH OIL) 1000 MG CAPS Take 1 capsule by mouth daily.    . TRUEPLUS LANCETS 28G MISC 1 each by Does not apply route 3 (three) times daily before meals. 100 each 12  . atorvastatin (LIPITOR) 40 MG tablet Take 1 tablet (40 mg total) by mouth daily. 90 tablet 1  . lisinopril-hydrochlorothiazide (ZESTORETIC) 20-12.5 MG tablet Take 2 tablets by mouth daily. 180 tablet 1  . metFORMIN (GLUCOPHAGE) 500 MG tablet Take 1  tablet (500 mg total) by mouth 2 (two) times daily with a meal. 180 tablet 1   No facility-administered medications prior to visit.     ROS Review of Systems  Constitutional: Negative for activity change, appetite change and fatigue.  HENT: Negative for congestion, sinus pressure and sore throat.   Eyes: Negative for visual disturbance.  Respiratory: Negative for cough, chest tightness, shortness of breath and wheezing.   Cardiovascular: Negative for chest pain and palpitations.  Gastrointestinal: Negative for abdominal distention, abdominal pain and constipation.  Endocrine: Negative for polydipsia.  Genitourinary: Negative for dysuria and frequency.  Musculoskeletal: Negative for arthralgias and back pain.  Skin: Negative for rash.  Neurological: Negative for tremors, light-headedness and numbness.  Hematological: Does not bruise/bleed easily.  Psychiatric/Behavioral: Negative for agitation and behavioral problems.    Objective:  BP 125/76 (BP Location: Right Arm, Patient Position: Sitting, Cuff Size: Large)   Pulse 69   Temp 98.1 F (36.7 C) (Oral)   Ht 5\' 4"  (1.626 m)   Wt 213 lb 3.2 oz (96.7 kg)   SpO2 100%   BMI 36.60 kg/m   BP/Weight 10/04/2016 07/05/2016 06/26/2016  Systolic BP 125 120 181  Diastolic BP 76 80 103  Wt. (Lbs) 213.2 201 206.4  BMI 36.6 34.5 35.43      Physical Exam  Constitutional: She is oriented to person, place, and time. She appears well-developed and well-nourished.  Obese  HENT:  Right Ear: External ear normal.  Left Ear: External ear normal.  Cardiovascular: Normal rate, normal heart sounds and intact distal pulses.   No murmur heard. Pulmonary/Chest: Effort normal and breath sounds normal. She has no wheezes. She has no rales. She exhibits no tenderness.  Abdominal: Soft. Bowel sounds are normal. She exhibits no distension and no mass. There is no tenderness.  Musculoskeletal: Normal range of motion. She exhibits no edema or tenderness  (crepitus on range of motion of both knee).  Neurological: She is alert and oriented to person, place, and time.  Skin: Skin is warm.  Psychiatric: She has a normal mood and affect.    CMP Latest Ref Rng & Units 06/27/2016 09/10/2015 08/28/2015  Glucose 65 - 99 mg/dL 85 91 161(W)  BUN 7 - 25 mg/dL 16 12 10   Creatinine 0.50 - 1.05 mg/dL 9.60 4.54 0.98  Sodium 135 - 146 mmol/L 139 142 140  Potassium 3.5 - 5.3 mmol/L 4.2 4.5 3.7  Chloride 98 - 110 mmol/L 102 103 101  CO2 20 - 31 mmol/L 30 31 28   Calcium 8.6 - 10.4 mg/dL 9.7 9.7 9.6  Total Protein 6.1 - 8.1 g/dL 7.3 7.3 -  Total Bilirubin 0.2 - 1.2 mg/dL 0.8 0.7 -  Alkaline Phos 33 - 130 U/L 59 65 -  AST 10 - 35 U/L 13 17 -  ALT 6 - 29 U/L 10 15 -    Lipid Panel     Component Value Date/Time   CHOL 231 (H) 06/27/2016 0918   TRIG 200 (H) 06/27/2016 0918   HDL 49 (L) 06/27/2016 0918   CHOLHDL 4.7 06/27/2016 0918   VLDL 40 (H) 06/27/2016 0918   LDLCALC 142 (H) 06/27/2016 0918    Lab Results  Component Value Date   HGBA1C 5.7 10/04/2016    Assessment & Plan:   1. Type 2 diabetes mellitus without complication, without long-term current use of insulin (HCC) Controlled with A1c of 5.7 Continue current management Encouraged to set up appointment for annual eye exam - Glucose (CBG) - HgB A1c - metFORMIN (GLUCOPHAGE) 500 MG tablet; Take 1 tablet (500 mg total) by mouth 2 (two) times daily with a meal.  Dispense: 180 tablet; Refill: 1  2. Pure hypercholesterolemia Uncontrolled Continue low-cholesterol diet, weight loss and exercise Will adjust regimen accordingly after labs - atorvastatin (LIPITOR) 40 MG tablet; Take 1 tablet (40 mg total) by mouth daily.  Dispense: 90 tablet; Refill: 1 - COMPLETE METABOLIC PANEL WITH GFR; Future - Lipid Panel w/reflex Direct LDL; Future  3. Essential hypertension Controlled - lisinopril-hydrochlorothiazide (ZESTORETIC) 20-12.5 MG tablet; Take 2 tablets by mouth daily.  Dispense: 180 tablet;  Refill: 1  4. Class 2 obesity due to excess calories without serious comorbidity with body mass index (BMI) of 36.0 to 36.9 in adult Discussed weight loss Reducing portion sizes, increasing physical activity-30 minutes on 5 days of the week   Meds ordered this encounter  Medications  . atorvastatin (LIPITOR) 40 MG tablet    Sig: Take 1 tablet (40 mg total) by mouth daily.    Dispense:  90 tablet    Refill:  1    Discontinue previous dose  . lisinopril-hydrochlorothiazide (ZESTORETIC) 20-12.5 MG tablet    Sig: Take 2 tablets by mouth daily.    Dispense:  180 tablet    Refill:  1  . metFORMIN (GLUCOPHAGE) 500 MG tablet    Sig: Take 1 tablet (500 mg total) by mouth 2 (two) times daily with a meal.    Dispense:  180 tablet    Refill:  1    Follow-up: Return in about 3 months (around 01/04/2017) for follow up on Diabetetes mellitus.   Jaclyn ShaggyEnobong Amao MD

## 2016-10-05 ENCOUNTER — Ambulatory Visit: Payer: Self-pay | Attending: Family Medicine

## 2016-10-05 DIAGNOSIS — E7800 Pure hypercholesterolemia, unspecified: Secondary | ICD-10-CM | POA: Insufficient documentation

## 2016-10-05 LAB — COMPLETE METABOLIC PANEL WITH GFR
ALBUMIN: 4.3 g/dL (ref 3.6–5.1)
ALK PHOS: 60 U/L (ref 33–130)
ALT: 14 U/L (ref 6–29)
AST: 16 U/L (ref 10–35)
BILIRUBIN TOTAL: 0.6 mg/dL (ref 0.2–1.2)
BUN: 17 mg/dL (ref 7–25)
CALCIUM: 9.7 mg/dL (ref 8.6–10.4)
CO2: 29 mmol/L (ref 20–31)
CREATININE: 0.9 mg/dL (ref 0.50–1.05)
Chloride: 103 mmol/L (ref 98–110)
GFR, EST NON AFRICAN AMERICAN: 72 mL/min (ref >=60)
GFR, Est African American: 83 mL/min (ref >=60)
GLUCOSE: 89 mg/dL (ref 65–99)
Potassium: 4.1 mmol/L (ref 3.5–5.3)
Sodium: 142 mmol/L (ref 135–146)
Total Protein: 7.4 g/dL (ref 6.1–8.1)

## 2016-10-05 LAB — LIPID PANEL W/REFLEX DIRECT LDL
Cholesterol: 166 mg/dL (ref <200)
HDL: 61 mg/dL (ref >50)
LDL-CHOLESTEROL: 83 mg/dL
Non-HDL Cholesterol (Calc): 105 mg/dL (ref <130)
Total CHOL/HDL Ratio: 2.7 Ratio (ref <5.0)
Triglycerides: 123 mg/dL (ref <150)

## 2016-10-05 NOTE — Progress Notes (Signed)
Patient here for lab visit only 

## 2017-01-19 MED FILL — LISINOPRIL-HCTZ 20-12.5 MG: 20-12.5 | 90 days supply | Qty: 180 | Fill #1

## 2017-01-30 ENCOUNTER — Other Ambulatory Visit: Payer: Self-pay | Admitting: Family Medicine

## 2017-01-30 DIAGNOSIS — E119 Type 2 diabetes mellitus without complications: Secondary | ICD-10-CM

## 2017-01-30 DIAGNOSIS — E7800 Pure hypercholesterolemia, unspecified: Secondary | ICD-10-CM

## 2017-01-30 MED FILL — ?METFORMIN HCL 500MG TABLET: 500 | 90 days supply | Qty: 180 | Fill #1

## 2017-01-30 MED FILL — ATORVASTATIN 40 MG TABLET: 40 | 90 days supply | Qty: 90 | Fill #1

## 2017-02-09 ENCOUNTER — Ambulatory Visit: Payer: Self-pay | Admitting: Family Medicine

## 2017-04-30 ENCOUNTER — Encounter: Payer: Self-pay | Admitting: Family Medicine

## 2017-04-30 ENCOUNTER — Ambulatory Visit: Payer: Self-pay | Attending: Family Medicine | Admitting: Family Medicine

## 2017-04-30 VITALS — BP 135/70 | HR 82 | Temp 97.9°F | Ht 64.0 in | Wt 232.8 lb

## 2017-04-30 DIAGNOSIS — G8929 Other chronic pain: Secondary | ICD-10-CM | POA: Insufficient documentation

## 2017-04-30 DIAGNOSIS — Z7982 Long term (current) use of aspirin: Secondary | ICD-10-CM | POA: Insufficient documentation

## 2017-04-30 DIAGNOSIS — M25562 Pain in left knee: Secondary | ICD-10-CM | POA: Insufficient documentation

## 2017-04-30 DIAGNOSIS — E785 Hyperlipidemia, unspecified: Secondary | ICD-10-CM | POA: Insufficient documentation

## 2017-04-30 DIAGNOSIS — Z7984 Long term (current) use of oral hypoglycemic drugs: Secondary | ICD-10-CM | POA: Insufficient documentation

## 2017-04-30 DIAGNOSIS — Z79899 Other long term (current) drug therapy: Secondary | ICD-10-CM | POA: Insufficient documentation

## 2017-04-30 DIAGNOSIS — E7800 Pure hypercholesterolemia, unspecified: Secondary | ICD-10-CM | POA: Insufficient documentation

## 2017-04-30 DIAGNOSIS — E669 Obesity, unspecified: Secondary | ICD-10-CM | POA: Insufficient documentation

## 2017-04-30 DIAGNOSIS — E119 Type 2 diabetes mellitus without complications: Secondary | ICD-10-CM | POA: Insufficient documentation

## 2017-04-30 DIAGNOSIS — J45909 Unspecified asthma, uncomplicated: Secondary | ICD-10-CM | POA: Insufficient documentation

## 2017-04-30 DIAGNOSIS — M25561 Pain in right knee: Secondary | ICD-10-CM | POA: Insufficient documentation

## 2017-04-30 DIAGNOSIS — Z23 Encounter for immunization: Secondary | ICD-10-CM | POA: Insufficient documentation

## 2017-04-30 DIAGNOSIS — M545 Low back pain: Secondary | ICD-10-CM | POA: Insufficient documentation

## 2017-04-30 DIAGNOSIS — I10 Essential (primary) hypertension: Secondary | ICD-10-CM | POA: Insufficient documentation

## 2017-04-30 LAB — POCT GLYCOSYLATED HEMOGLOBIN (HGB A1C): HEMOGLOBIN A1C: 6.3

## 2017-04-30 LAB — GLUCOSE, POCT (MANUAL RESULT ENTRY): POC Glucose: 100 mg/dl — AB (ref 70–99)

## 2017-04-30 MED ORDER — TIZANIDINE HCL 4 MG PO TABS: 4.0000 mg | ORAL_TABLET | Freq: Three times a day (TID) | ORAL | 3 refills | Status: DC | PRN

## 2017-04-30 MED ORDER — LISINOPRIL-HYDROCHLOROTHIAZIDE 20-12.5 MG PO TABS: 2.0000 | ORAL_TABLET | Freq: Every day | ORAL | 1 refills | Status: DC

## 2017-04-30 MED ORDER — ATORVASTATIN CALCIUM 40 MG PO TABS: 40.0000 mg | ORAL_TABLET | Freq: Every day | ORAL | 1 refills | Status: DC

## 2017-04-30 MED ORDER — MELOXICAM 7.5 MG PO TABS: 7.5000 mg | ORAL_TABLET | Freq: Every day | ORAL | 3 refills | Status: DC

## 2017-04-30 MED ORDER — METFORMIN HCL 500 MG PO TABS: 500.0000 mg | ORAL_TABLET | Freq: Two times a day (BID) | ORAL | 1 refills | Status: DC

## 2017-04-30 MED FILL — ?ATORVASTATIN 40MG TABLET: 40 | 30 days supply | Qty: 30 | Fill #0

## 2017-04-30 MED FILL — tiZANidine HCL 4 MG TABS: 4 | 30 days supply | Qty: 90 | Fill #0

## 2017-04-30 MED FILL — ?METFORMIN HCL 500MG TABLET: 500 | 30 days supply | Qty: 60 | Fill #0

## 2017-04-30 NOTE — Progress Notes (Signed)
Subjective:  Patient ID: Paula Serrano, female    DOB: [birth year]  Age: 56 y.o. MRN: [id number]  CC: Diabetes and Hypertension   HPI Paula Serrano is a 56 year old female with a history of type 2 diabetes mellitus (A1c 6.3), hypertension, hyperlipidemia, Obesity who presents today for a follow-up visit.  She complains of pain in both knees which are worse when going up a hill and after walking as well as low back pain which spreads across her lumbar region that is rated as 8/10. Pain does not radiate from her back to her lower extremities but she sometimes feels like her knees will give out on her. She denies visual complaints, numbness in extremities or hypoglycemia. Used Aleve with minimal relief in symptoms.  With regards to her diabetes mellitus she has been compliant with her medications and a diabetic diet. She tolerates her antihypertensives and statin with no complaints of myalgias  Past Medical History:  Diagnosis Date  . Asthma   . Hypertension     History reviewed. No pertinent surgical history.  No Known Allergies   Outpatient Medications Prior to Visit  Medication Sig Dispense Refill  . aspirin EC 81 MG tablet Take 81 mg by mouth daily.    . Blood Glucose Monitoring Suppl (TRUE METRIX METER) DEVI 1 each by Does not apply route 3 (three) times daily before meals. 1 Device 0  . calcium-vitamin D (OSCAL WITH D) 500-200 MG-UNIT tablet Take 1 tablet by mouth daily with breakfast.    . glucose blood (TRUE METRIX BLOOD GLUCOSE TEST) test strip Use 3 times daily before meals 100 each 12  . Multiple Vitamins-Minerals (MULTIVITAMIN & MINERAL PO) Take 1 tablet by mouth daily.    . Omega-3 Fatty Acids (FISH OIL) 1000 MG CAPS Take 1 capsule by mouth daily.    . TRUEPLUS LANCETS 28G MISC 1 each by Does not apply route 3 (three) times daily before meals. 100 each 12  . atorvastatin (LIPITOR) 40 MG tablet Take 1 tablet (40 mg total) by mouth daily. 90 tablet 1  .  lisinopril-hydrochlorothiazide (ZESTORETIC) 20-12.5 MG tablet Take 2 tablets by mouth daily. 180 tablet 1  . metFORMIN (GLUCOPHAGE) 500 MG tablet Take 1 tablet (500 mg total) by mouth 2 (two) times daily with a meal. 180 tablet 1   No facility-administered medications prior to visit.     ROS Review of Systems  Constitutional: Negative for activity change, appetite change and fatigue.  HENT: Negative for congestion, sinus pressure and sore throat.   Eyes: Negative for visual disturbance.  Respiratory: Negative for cough, chest tightness, shortness of breath and wheezing.   Cardiovascular: Negative for chest pain and palpitations.  Gastrointestinal: Negative for abdominal distention, abdominal pain and constipation.  Endocrine: Negative for polydipsia.  Genitourinary: Negative for dysuria and frequency.  Musculoskeletal:       See hpi  Skin: Negative for rash.  Neurological: Negative for tremors, light-headedness and numbness.  Hematological: Does not bruise/bleed easily.  Psychiatric/Behavioral: Negative for agitation and behavioral problems.     Objective:  BP 135/70   Pulse 82   Temp 97.9 F (36.6 C) (Oral)   Ht 5' 4"  (1.626 m)   Wt 232 lb 12.8 oz (105.6 kg)   SpO2 97%   BMI 39.96 kg/m   BP/Weight 04/30/2017 10/04/2016 73/53/2992  Systolic BP 426 834 196  Diastolic BP 70 76 80  Wt. (Lbs) 232.8 213.2 201  BMI 39.96 36.6 34.5      Physical Exam  Constitutional: She is oriented to person, place, and time. She appears well-developed and well-nourished.  Cardiovascular: Normal rate, normal heart sounds and intact distal pulses.   No murmur heard. Pulmonary/Chest: Effort normal and breath sounds normal. She has no wheezes. She has no rales. She exhibits no tenderness.  Abdominal: Soft. Bowel sounds are normal. She exhibits no distension and no mass. There is no tenderness.  Musculoskeletal: Normal range of motion. She exhibits edema (slight right knee edema) and tenderness  (tenderness and crepitus on flexion and extension of both knees).  No tenderness in lumbosacral on palpation; negative straight leg raise bilaterally  Neurological: She is alert and oriented to person, place, and time.  Skin: Skin is warm and dry.  Psychiatric: She has a normal mood and affect.    CMP Latest Ref Rng & Units 10/05/2016 06/27/2016 09/10/2015  Glucose 65 - 99 mg/dL 89 85 91  BUN 7 - 25 mg/dL 17 16 12   Creatinine 0.50 - 1.05 mg/dL 0.90 0.90 0.78  Sodium 135 - 146 mmol/L 142 139 142  Potassium 3.5 - 5.3 mmol/L 4.1 4.2 4.5  Chloride 98 - 110 mmol/L 103 102 103  CO2 20 - 31 mmol/L 29 30 31   Calcium 8.6 - 10.4 mg/dL 9.7 9.7 9.7  Total Protein 6.1 - 8.1 g/dL 7.4 7.3 7.3  Total Bilirubin 0.2 - 1.2 mg/dL 0.6 0.8 0.7  Alkaline Phos 33 - 130 U/L 60 59 65  AST 10 - 35 U/L 16 13 17   ALT 6 - 29 U/L 14 10 15     Lipid Panel     Component Value Date/Time   CHOL 166 10/05/2016 0912   TRIG 123 10/05/2016 0912   HDL 61 10/05/2016 0912   CHOLHDL 2.7 10/05/2016 0912   VLDL 40 (H) 06/27/2016 0918   LDLCALC 142 (H) 06/27/2016 0918    Lab Results  Component Value Date   HGBA1C 6.3 04/30/2017    Assessment & Plan:   1. Type 2 diabetes mellitus without complication, without long-term current use of insulin (HCC) Controlled with A1c of 6.3 Continue medications, lifestyle modifications - POCT glucose (manual entry) - POCT glycosylated hemoglobin (Hb A1C) - metFORMIN (GLUCOPHAGE) 500 MG tablet; Take 1 tablet (500 mg total) by mouth 2 (two) times daily with a meal.  Dispense: 180 tablet; Refill: 1 - CMP14+EGFR - Lipid panel - Microalbumin/Creatinine Ratio, Urine  2. Pure hypercholesterolemia Uncontrolled with LDL of 142; goal is less than 100 Low cholesterol diet Lipid panel and will Adjust regimen accordingly - atorvastatin (LIPITOR) 40 MG tablet; Take 1 tablet (40 mg total) by mouth daily.  Dispense: 90 tablet; Refill: 1  3. Essential hypertension Slight above goal of less  than 130/80 Low-sodium diet No regimen changes today - lisinopril-hydrochlorothiazide (ZESTORETIC) 20-12.5 MG tablet; Take 2 tablets by mouth daily.  Dispense: 180 tablet; Refill: 1  4. Chronic bilateral low back pain without sciatica Advised to apply heat - tiZANidine (ZANAFLEX) 4 MG tablet; Take 1 tablet (4 mg total) by mouth every 8 (eight) hours as needed for muscle spasms.  Dispense: 90 tablet; Refill: 3  5. Chronic pain of both knees Likely underlying osteoarthritis - meloxicam (MOBIC) 7.5 MG tablet; Take 1 tablet (7.5 mg total) by mouth daily.  Dispense: 30 tablet; Refill: 3  6. Need for influenza vaccination Flu shot administered   Meds ordered this encounter  Medications  . metFORMIN (GLUCOPHAGE) 500 MG tablet    Sig: Take 1 tablet (500 mg total) by mouth 2 (two) times  daily with a meal.    Dispense:  180 tablet    Refill:  1  . atorvastatin (LIPITOR) 40 MG tablet    Sig: Take 1 tablet (40 mg total) by mouth daily.    Dispense:  90 tablet    Refill:  1    Discontinue previous dose  . lisinopril-hydrochlorothiazide (ZESTORETIC) 20-12.5 MG tablet    Sig: Take 2 tablets by mouth daily.    Dispense:  180 tablet    Refill:  1  . meloxicam (MOBIC) 7.5 MG tablet    Sig: Take 1 tablet (7.5 mg total) by mouth daily.    Dispense:  30 tablet    Refill:  3  . tiZANidine (ZANAFLEX) 4 MG tablet    Sig: Take 1 tablet (4 mg total) by mouth every 8 (eight) hours as needed for muscle spasms.    Dispense:  90 tablet    Refill:  3    Follow-up: Return in about 6 months (around 10/29/2017) for Follow-up of diabetes and hypertension.   Arnoldo Morale MD

## 2017-05-01 LAB — CMP14+EGFR
ALBUMIN: 4.6 g/dL (ref 3.5–5.5)
ALT: 11 IU/L (ref 0–32)
AST: 12 IU/L (ref 0–40)
Albumin/Globulin Ratio: 1.7 (ref 1.2–2.2)
Alkaline Phosphatase: 77 IU/L (ref 39–117)
BILIRUBIN TOTAL: 0.6 mg/dL (ref 0.0–1.2)
BUN / CREAT RATIO: 17 (ref 9–23)
BUN: 17 mg/dL (ref 6–24)
CALCIUM: 9.5 mg/dL (ref 8.7–10.2)
CHLORIDE: 101 mmol/L (ref 96–106)
CO2: 26 mmol/L (ref 20–29)
CREATININE: 1.02 mg/dL — AB (ref 0.57–1.00)
GFR, EST AFRICAN AMERICAN: 71 mL/min/{1.73_m2} (ref >59)
GFR, EST NON AFRICAN AMERICAN: 62 mL/min/{1.73_m2} (ref >59)
GLUCOSE: 100 mg/dL — AB (ref 65–99)
Globulin, Total: 2.7 g/dL (ref 1.5–4.5)
Potassium: 4.2 mmol/L (ref 3.5–5.2)
Sodium: 144 mmol/L (ref 134–144)
TOTAL PROTEIN: 7.3 g/dL (ref 6.0–8.5)

## 2017-05-01 LAB — LIPID PANEL
Chol/HDL Ratio: 2.9 ratio (ref 0.0–4.4)
Cholesterol, Total: 161 mg/dL (ref 100–199)
HDL: 55 mg/dL (ref >39)
LDL Calculated: 79 mg/dL (ref 0–99)
Triglycerides: 133 mg/dL (ref 0–149)
VLDL Cholesterol Cal: 27 mg/dL (ref 5–40)

## 2017-05-01 LAB — MICROALBUMIN / CREATININE URINE RATIO
CREATININE, UR: 184 mg/dL
MICROALBUM., U, RANDOM: 10.4 ug/mL
Microalb/Creat Ratio: 5.7 mg/g creat (ref 0.0–30.0)

## 2017-05-11 MED FILL — LISINOPRIL-HCTZ 20-12.5 MG: 20-12.5 | 90 days supply | Qty: 180 | Fill #0

## 2017-05-11 MED FILL — MELOXICAM 7.5 MG TABLET: 7.5 | 90 days supply | Qty: 90 | Fill #0

## 2017-07-19 MED FILL — ATORVASTATIN 40 MG TABLET: 40 | 30 days supply | Qty: 30 | Fill #1

## 2017-07-19 MED FILL — ?METFORMIN HCL 500MG TABLET: 500 | 30 days supply | Qty: 60 | Fill #1

## 2017-07-19 MED FILL — LISINOPRIL-HCTZ 20-12.5 MG: 20-12.5 | 90 days supply | Qty: 180 | Fill #1

## 2017-09-20 ENCOUNTER — Other Ambulatory Visit: Payer: Self-pay | Admitting: Family Medicine

## 2017-09-20 DIAGNOSIS — I10 Essential (primary) hypertension: Secondary | ICD-10-CM

## 2017-09-20 MED FILL — metFORMIN HCL 500 MG TABS: 500 | 30 days supply | Qty: 60 | Fill #2

## 2017-09-20 MED FILL — ATORVASTATIN 40 MG TABLET: 40 | 30 days supply | Qty: 30 | Fill #2

## 2017-10-31 ENCOUNTER — Other Ambulatory Visit: Payer: Self-pay | Admitting: Family Medicine

## 2017-10-31 DIAGNOSIS — I10 Essential (primary) hypertension: Secondary | ICD-10-CM

## 2017-10-31 MED FILL — ATORVASTATIN CALCIUM 40 MG: 40 | 90 days supply | Qty: 90 | Fill #3

## 2017-10-31 MED FILL — LISINOPRIL-HCTZ 20-12.5 MG: 20-12.5 | 30 days supply | Qty: 60 | Fill #0

## 2017-10-31 MED FILL — metFORMIN HCL 500 MG TABS: 500 | 90 days supply | Qty: 180 | Fill #3

## 2017-11-01 ENCOUNTER — Other Ambulatory Visit: Payer: Self-pay | Admitting: Family Medicine

## 2017-11-01 DIAGNOSIS — I10 Essential (primary) hypertension: Secondary | ICD-10-CM

## 2017-11-26 MED FILL — tiZANidine HCL 4 MG TABS: 4 | 30 days supply | Qty: 90 | Fill #1

## 2017-11-26 MED FILL — MELOXICAM 7.5 MG TABLET: 7.5 | 30 days supply | Qty: 30 | Fill #1

## 2017-12-17 ENCOUNTER — Other Ambulatory Visit: Payer: Self-pay | Admitting: Family Medicine

## 2017-12-17 DIAGNOSIS — E119 Type 2 diabetes mellitus without complications: Secondary | ICD-10-CM

## 2017-12-17 DIAGNOSIS — I10 Essential (primary) hypertension: Secondary | ICD-10-CM

## 2017-12-18 ENCOUNTER — Other Ambulatory Visit: Payer: Self-pay

## 2017-12-27 ENCOUNTER — Other Ambulatory Visit (HOSPITAL_COMMUNITY)
Admission: RE | Admit: 2017-12-27 | Discharge: 2017-12-27 | Disposition: A | Discharge status: Home / Self Care | Payer: Medicaid Other | Source: Ambulatory Visit | Attending: Family Medicine | Admitting: Family Medicine

## 2017-12-27 ENCOUNTER — Ambulatory Visit: Payer: Self-pay | Attending: Family Medicine | Admitting: Physician Assistant

## 2017-12-27 VITALS — BP 138/68 | HR 73 | Temp 98.6°F | Resp 18 | Ht 63.5 in | Wt 234.0 lb

## 2017-12-27 DIAGNOSIS — I10 Essential (primary) hypertension: Secondary | ICD-10-CM | POA: Insufficient documentation

## 2017-12-27 DIAGNOSIS — N3001 Acute cystitis with hematuria: Secondary | ICD-10-CM | POA: Insufficient documentation

## 2017-12-27 DIAGNOSIS — G8929 Other chronic pain: Secondary | ICD-10-CM | POA: Insufficient documentation

## 2017-12-27 DIAGNOSIS — Z79899 Other long term (current) drug therapy: Secondary | ICD-10-CM | POA: Insufficient documentation

## 2017-12-27 DIAGNOSIS — J45909 Unspecified asthma, uncomplicated: Secondary | ICD-10-CM | POA: Insufficient documentation

## 2017-12-27 DIAGNOSIS — M25562 Pain in left knee: Secondary | ICD-10-CM | POA: Insufficient documentation

## 2017-12-27 DIAGNOSIS — Z7984 Long term (current) use of oral hypoglycemic drugs: Secondary | ICD-10-CM | POA: Insufficient documentation

## 2017-12-27 DIAGNOSIS — M25561 Pain in right knee: Secondary | ICD-10-CM | POA: Insufficient documentation

## 2017-12-27 DIAGNOSIS — E119 Type 2 diabetes mellitus without complications: Secondary | ICD-10-CM | POA: Insufficient documentation

## 2017-12-27 DIAGNOSIS — E7800 Pure hypercholesterolemia, unspecified: Secondary | ICD-10-CM | POA: Insufficient documentation

## 2017-12-27 DIAGNOSIS — R531 Weakness: Secondary | ICD-10-CM | POA: Insufficient documentation

## 2017-12-27 DIAGNOSIS — Z7982 Long term (current) use of aspirin: Secondary | ICD-10-CM | POA: Insufficient documentation

## 2017-12-27 LAB — POCT URINALYSIS DIPSTICK
BILIRUBIN UA: NEGATIVE
GLUCOSE UA: NEGATIVE
KETONES UA: NEGATIVE
Nitrite, UA: NEGATIVE
PH UA: 7.5 (ref 5.0–8.0)
Protein, UA: POSITIVE — AB
Spec Grav, UA: 1.015 (ref 1.010–1.025)
UROBILINOGEN UA: 0.2 U/dL

## 2017-12-27 LAB — GLUCOSE, POCT (MANUAL RESULT ENTRY): POC Glucose: 106 mg/dl — AB (ref 70–99)

## 2017-12-27 LAB — POCT GLYCOSYLATED HEMOGLOBIN (HGB A1C): HBA1C, POC (PREDIABETIC RANGE): 6.3 % (ref 5.7–6.4)

## 2017-12-27 MED ORDER — LISINOPRIL-HYDROCHLOROTHIAZIDE 20-12.5 MG PO TABS: 2.0000 | ORAL_TABLET | Freq: Every day | ORAL | 3 refills | Status: DC

## 2017-12-27 MED ORDER — METFORMIN HCL 500 MG PO TABS: 500.0000 mg | ORAL_TABLET | Freq: Two times a day (BID) | ORAL | 3 refills | Status: DC

## 2017-12-27 MED ORDER — ATORVASTATIN CALCIUM 40 MG PO TABS: 40.0000 mg | ORAL_TABLET | Freq: Every day | ORAL | 2 refills | Status: DC

## 2017-12-27 MED ORDER — TRUEPLUS LANCETS 28G MISC: 1.0000 | Freq: Three times a day (TID) | 12 refills | Status: AC

## 2017-12-27 MED ORDER — MELOXICAM 7.5 MG PO TABS: 7.5000 mg | ORAL_TABLET | Freq: Every day | ORAL | 3 refills | Status: DC

## 2017-12-27 MED ORDER — NITROFURANTOIN MONOHYD MACRO 100 MG PO CAPS: 100.0000 mg | ORAL_CAPSULE | Freq: Two times a day (BID) | ORAL | 0 refills | Status: DC

## 2017-12-27 MED ORDER — GLUCOSE BLOOD VI STRP: ORAL_STRIP | 12 refills | Status: DC

## 2017-12-27 MED FILL — TRUEplus LANCETS 28G MISC: 30 days supply | Qty: 100 | Fill #0

## 2017-12-27 MED FILL — NITROFURANTOIN MONO-MCR 100: 100 | 3 days supply | Qty: 6 | Fill #0

## 2017-12-27 MED FILL — MELOXICAM 7.5 MG TABLET: 7.5 | 90 days supply | Qty: 90 | Fill #0

## 2017-12-27 MED FILL — metFORMIN HCL 500 MG TABS: 500 | 90 days supply | Qty: 180 | Fill #0

## 2017-12-27 MED FILL — LISINOPRIL-HCTZ 20-12.5 MG: 20-12.5 | 90 days supply | Qty: 180 | Fill #0

## 2017-12-27 MED FILL — TRUE METRIX TEST STRIP: 30 days supply | Qty: 100 | Fill #0

## 2017-12-27 NOTE — Progress Notes (Signed)
Patient ID: Paula Serrano, female   DOB: 05/10/1961, 57 y.o.   MRN: [id number]   Paula Serrano, is a 57 y.o. female  ZHY:[id number]  [id number]  DOB - 03/01/1961  Subjective:  Chief Complaint and HPI: Paula Serrano is a 57 y.o. female here today for multiple issues and medication RF.  5 day h/o urinary frequency and dysuria.  No vaginal d/c.  No precipitating or alleviating factors.  No abdominal pain.  No f/c.    L knee has increased in pain and "gives way" on her at times in the last week.  NKI.    Compliant with meds but eats lots of starches.   Daughter is helping with history and the patient is going out of the country for about 9 months in July.    ROS:   Constitutional:  No f/c, No night sweats, No unexplained weight loss. EENT:  No vision changes, No blurry vision, No hearing changes. No mouth, throat, or ear problems.  Respiratory: No cough, No SOB Cardiac: No CP, no palpitations GI:  No abd pain, No N/V/D. GU: +Urinary s/sx Musculoskeletal: +L knee pain Neuro: No headache, no dizziness, no motor weakness.  Skin: No rash Endocrine:  No polydipsia. No polyuria.  Psych: Denies SI/HI  No problems updated.  ALLERGIES: No Known Allergies  PAST MEDICAL HISTORY: Past Medical History:  Diagnosis Date  . Asthma   . Hypertension     MEDICATIONS AT HOME: Prior to Admission medications   Medication Sig Start Date End Date Taking? Authorizing Provider  aspirin EC 81 MG tablet Take 81 mg by mouth daily.   Yes [provider]  atorvastatin (LIPITOR) 40 MG tablet Take 1 tablet (40 mg total) by mouth daily. 12/27/17  Yes McClung, Angela M, PA-C  Blood Glucose Monitoring Suppl (TRUE METRIX METER) DEVI 1 each by Does not apply route 3 (three) times daily before meals. 09/10/15  Yes Hoy RegisterNewlin, Enobong, MD  calcium-vitamin D (OSCAL WITH D) 500-200 MG-UNIT tablet Take 1 tablet by mouth daily with breakfast.   Yes [provider]  glucose blood (TRUE METRIX BLOOD  GLUCOSE TEST) test strip Use 3 times daily before meals 12/27/17  Yes McClung, Angela M, PA-C  lisinopril-hydrochlorothiazide (PRINZIDE,ZESTORETIC) 20-12.5 MG tablet Take 2 tablets by mouth daily. 12/27/17  Yes Anders SimmondsMcClung, Angela M, PA-C  meloxicam (MOBIC) 7.5 MG tablet Take 1 tablet (7.5 mg total) by mouth daily. 12/27/17  Yes Georgian CoMcClung, Angela M, PA-C  metFORMIN (GLUCOPHAGE) 500 MG tablet Take 1 tablet (500 mg total) by mouth 2 (two) times daily with a meal. 12/27/17  Yes McClung, Angela M, PA-C  Multiple Vitamins-Minerals (MULTIVITAMIN & MINERAL PO) Take 1 tablet by mouth daily.   Yes [provider]  Omega-3 Fatty Acids (FISH OIL) 1000 MG CAPS Take 1 capsule by mouth daily.   Yes [provider]  TRUEPLUS LANCETS 28G MISC 1 each by Does not apply route 3 (three) times daily before meals. 12/27/17  Yes McClung, Marzella SchleinAngela M, PA-C  nitrofurantoin, macrocrystal-monohydrate, (MACROBID) 100 MG capsule Take 1 capsule (100 mg total) by mouth 2 (two) times daily. 12/27/17   Anders SimmondsMcClung, Angela M, PA-C  tiZANidine (ZANAFLEX) 4 MG tablet Take 1 tablet (4 mg total) by mouth every 8 (eight) hours as needed for muscle spasms. Patient not taking: Reported on 12/27/2017 04/30/17   Hoy RegisterNewlin, Enobong, MD     Objective:  EXAM:   Vitals:   12/27/17 0936  BP: 138/68  Pulse: 73  Resp: 18  Temp: 98.6 F (  37 C)  TempSrc: Oral  SpO2: 98%  Weight: 234 lb (106.1 kg)  Height: 5' 3.5" (1.613 m)    General appearance : A&OX3. NAD. Non-toxic-appearing HEENT: Atraumatic and Normocephalic.  PERRLA. EOM intact.  Neck: supple, no JVD. No cervical lymphadenopathy. No thyromegaly Chest/Lungs:  Breathing-non-labored, Good air entry bilaterally, breath sounds normal without rales, rhonchi, or wheezing  CVS: S1 S2 regular, no murmurs, gallops, rubs  Abdomen: Bowel sounds present, Non tender and not distended with no gaurding, rigidity or rebound. Extremities: Bilateral Lower Ext shows no edema, both legs are warm to touch with  = pulse throughout.  L knee is stable with normal ROM, no effusion and no joint laxity Neurology:  CN II-XII grossly intact, Non focal.   Psych:  TP linear. J/I WNL. Normal speech. Appropriate eye contact and affect.  Skin:  No Rash  Data Review Lab Results  Component Value Date   HGBA1C 6.3 12/27/2017   HGBA1C 6.3 04/30/2017   HGBA1C 5.7 10/04/2016     Assessment & Plan   1. Acute cystitis with hematuria macrobid bid X 3 days.  Increase water intake - Urine Culture - Urine cytology ancillary only  2. Type 2 diabetes mellitus without complication, without long-term current use of insulin (HCC) Not at goal.  Work on eliminating starches.   - Glucose (CBG) - HgB A1c - Urinalysis Dipstick - metFORMIN (GLUCOPHAGE) 500 MG tablet; Take 1 tablet (500 mg total) by mouth 2 (two) times daily with a meal.  Dispense: 180 tablet; Refill: 3 - TRUEPLUS LANCETS 28G MISC; 1 each by Does not apply route 3 (three) times daily before meals.  Dispense: 100 each; Refill: 12 - glucose blood (TRUE METRIX BLOOD GLUCOSE TEST) test strip; Use 3 times daily before meals  Dispense: 100 each; Refill: 12 - Comprehensive metabolic panel  3. Essential hypertension Controlled-continue current regimen - lisinopril-hydrochlorothiazide (PRINZIDE,ZESTORETIC) 20-12.5 MG tablet; Take 2 tablets by mouth daily.  Dispense: 180 tablet; Refill: 3 - Comprehensive metabolic panel  4. Chronic pain of both knees With exacerbation and weakness of L knee - DG Knee Complete 4 Views Left; Future - meloxicam (MOBIC) 7.5 MG tablet; Take 1 tablet (7.5 mg total) by mouth daily.  Dispense: 30 tablet; Refill: 3  5. Pure hypercholesterolemia - atorvastatin (LIPITOR) 40 MG tablet; Take 1 tablet (40 mg total) by mouth daily.  Dispense: 90 tablet; Refill: 2   Patient have been counseled extensively about nutrition and exercise  Return in about 6 months (around 06/28/2018) for Dr Alvis Lemmings for DM and htn.  The patient was given clear  instructions to go to ER or return to medical center if symptoms don't improve, worsen or new problems develop. The patient verbalized understanding. The patient was told to call to get lab results if they haven't heard anything in the next week.     Georgian Co, PA-C [facility] [place], [place] [phone]   12/27/2017, 10:06 AM

## 2017-12-28 LAB — COMPREHENSIVE METABOLIC PANEL
ALBUMIN: 4.4 g/dL (ref 3.5–5.5)
ALT: 11 IU/L (ref 0–32)
AST: 12 IU/L (ref 0–40)
Albumin/Globulin Ratio: 1.5 (ref 1.2–2.2)
Alkaline Phosphatase: 75 IU/L (ref 39–117)
BUN/Creatinine Ratio: 14 (ref 9–23)
BUN: 13 mg/dL (ref 6–24)
Bilirubin Total: 0.5 mg/dL (ref 0.0–1.2)
CO2: 26 mmol/L (ref 20–29)
CREATININE: 0.94 mg/dL (ref 0.57–1.00)
Calcium: 9.6 mg/dL (ref 8.7–10.2)
Chloride: 101 mmol/L (ref 96–106)
GFR calc non Af Amer: 68 mL/min/{1.73_m2} (ref >59)
GFR, EST AFRICAN AMERICAN: 78 mL/min/{1.73_m2} (ref >59)
GLUCOSE: 109 mg/dL — AB (ref 65–99)
Globulin, Total: 3 g/dL (ref 1.5–4.5)
Potassium: 4 mmol/L (ref 3.5–5.2)
Sodium: 143 mmol/L (ref 134–144)
TOTAL PROTEIN: 7.4 g/dL (ref 6.0–8.5)

## 2017-12-28 LAB — URINE CYTOLOGY ANCILLARY ONLY
Chlamydia: NEGATIVE
Neisseria Gonorrhea: NEGATIVE
Trichomonas: NEGATIVE

## 2017-12-29 LAB — URINE CULTURE

## 2017-12-31 ENCOUNTER — Telehealth (INDEPENDENT_AMBULATORY_CARE_PROVIDER_SITE_OTHER): Payer: Self-pay | Admitting: *Deleted

## 2017-12-31 LAB — URINE CYTOLOGY ANCILLARY ONLY
BACTERIAL VAGINITIS: NEGATIVE
Candida vaginitis: NEGATIVE

## 2017-12-31 NOTE — Telephone Encounter (Signed)
-----   Message from Anders SimmondsAngela M McClung, [place] sent at 12/30/2017  8:41 PM EDT ----- Your labs did show urinary tract infection but were otherwise ok.  Your blood sugar was a little high, so cut down on your sugar intake.  The antibiotics I prescribed should take care of the urinary infection.  There was no STD.  Follow-up as planned. Thanks, Georgian CoAngela McClung, PA-C

## 2017-12-31 NOTE — Telephone Encounter (Signed)
Patient verified DOB Patient is aware of no STD's being noted. Patient should complete Macrobid to address UTI. Patient advised to cut back on sweets and follow up as planned. Patients daughter was authorized to receive information.

## 2018-01-01 ENCOUNTER — Ambulatory Visit (HOSPITAL_COMMUNITY)
Admission: RE | Admit: 2018-01-01 | Discharge: 2018-01-01 | Disposition: A | Discharge status: Home / Self Care | Payer: Medicaid Other | Source: Ambulatory Visit | Attending: Physician Assistant | Admitting: Physician Assistant

## 2018-01-01 DIAGNOSIS — M1712 Unilateral primary osteoarthritis, left knee: Secondary | ICD-10-CM | POA: Insufficient documentation

## 2018-01-01 DIAGNOSIS — M25562 Pain in left knee: Secondary | ICD-10-CM | POA: Insufficient documentation

## 2018-01-01 DIAGNOSIS — G8929 Other chronic pain: Secondary | ICD-10-CM | POA: Insufficient documentation

## 2018-01-01 DIAGNOSIS — M25561 Pain in right knee: Secondary | ICD-10-CM | POA: Insufficient documentation

## 2018-01-02 ENCOUNTER — Other Ambulatory Visit: Payer: Self-pay | Admitting: Physician Assistant

## 2018-01-02 ENCOUNTER — Telehealth: Payer: Self-pay | Admitting: *Deleted

## 2018-01-02 DIAGNOSIS — M25561 Pain in right knee: Secondary | ICD-10-CM

## 2018-01-02 DIAGNOSIS — G8929 Other chronic pain: Secondary | ICD-10-CM

## 2018-01-02 DIAGNOSIS — M545 Low back pain: Principal | ICD-10-CM

## 2018-01-02 DIAGNOSIS — M25562 Pain in left knee: Secondary | ICD-10-CM

## 2018-01-02 DIAGNOSIS — M1710 Unilateral primary osteoarthritis, unspecified knee: Secondary | ICD-10-CM

## 2018-01-02 NOTE — Telephone Encounter (Signed)
-----   Message from Anders SimmondsAngela M McClung, [place] sent at 01/02/2018  8:45 AM EDT ----- Paula AbrahamXray shows moderate to severe arthritis.  I placed a referral for orthopedics to see what options are for management.  Thanks, Georgian CoAngela McClung, PA-C

## 2018-01-02 NOTE — Telephone Encounter (Signed)
Medical Assistant left message on patient's home and cell voicemail. Voicemail states to [place] [facility] [place] [facility] [place] [facility] [phone]. Patient is aware of xray showing moderate to severe arthritis and an orthopedic surgeon referral being placed and patient will receive a phone with initial appointment.

## 2018-02-06 ENCOUNTER — Ambulatory Visit: Payer: Medicaid Other | Admitting: Family Medicine

## 2018-05-22 MED FILL — LISINOPRIL-HCTZ 20-12.5 MG: 20-12.5 | 90 days supply | Qty: 180 | Fill #1

## 2019-03-20 ENCOUNTER — Encounter (INDEPENDENT_AMBULATORY_CARE_PROVIDER_SITE_OTHER): Payer: Self-pay | Admitting: Primary Care

## 2019-03-20 ENCOUNTER — Other Ambulatory Visit: Payer: Self-pay

## 2019-03-20 ENCOUNTER — Ambulatory Visit (INDEPENDENT_AMBULATORY_CARE_PROVIDER_SITE_OTHER): Payer: Self-pay | Admitting: Primary Care

## 2019-03-20 VITALS — BP 154/89 | HR 75 | Temp 98.6°F | Ht 64.0 in | Wt 235.8 lb

## 2019-03-20 DIAGNOSIS — Z1211 Encounter for screening for malignant neoplasm of colon: Secondary | ICD-10-CM

## 2019-03-20 DIAGNOSIS — E6609 Other obesity due to excess calories: Secondary | ICD-10-CM

## 2019-03-20 DIAGNOSIS — M25551 Pain in right hip: Secondary | ICD-10-CM

## 2019-03-20 DIAGNOSIS — R7303 Prediabetes: Secondary | ICD-10-CM

## 2019-03-20 DIAGNOSIS — M25552 Pain in left hip: Secondary | ICD-10-CM

## 2019-03-20 DIAGNOSIS — E119 Type 2 diabetes mellitus without complications: Secondary | ICD-10-CM

## 2019-03-20 DIAGNOSIS — E7800 Pure hypercholesterolemia, unspecified: Secondary | ICD-10-CM

## 2019-03-20 DIAGNOSIS — I10 Essential (primary) hypertension: Secondary | ICD-10-CM

## 2019-03-20 DIAGNOSIS — Z6841 Body Mass Index (BMI) 40.0 and over, adult: Secondary | ICD-10-CM

## 2019-03-20 DIAGNOSIS — Z114 Encounter for screening for human immunodeficiency virus [HIV]: Secondary | ICD-10-CM

## 2019-03-20 DIAGNOSIS — Z23 Encounter for immunization: Secondary | ICD-10-CM

## 2019-03-20 LAB — POCT GLYCOSYLATED HEMOGLOBIN (HGB A1C): Hemoglobin A1C: 6.6 % — AB (ref 4.0–5.6)

## 2019-03-20 MED ORDER — LISINOPRIL-HYDROCHLOROTHIAZIDE 20-12.5 MG PO TABS: 2.0000 | ORAL_TABLET | Freq: Every day | ORAL | 0 refills | Status: DC

## 2019-03-20 MED ORDER — METFORMIN HCL 500 MG PO TABS: 1000.0000 mg | ORAL_TABLET | Freq: Two times a day (BID) | ORAL | 0 refills | Status: DC

## 2019-03-20 MED ORDER — ATORVASTATIN CALCIUM 40 MG PO TABS: 40.0000 mg | ORAL_TABLET | Freq: Every day | ORAL | 0 refills | Status: DC

## 2019-03-20 MED ORDER — CALCIUM CARBONATE-VITAMIN D 500-200 MG-UNIT PO TABS: 1.0000 | ORAL_TABLET | Freq: Every day | ORAL | 3 refills | Status: AC

## 2019-03-20 MED ORDER — TRUE METRIX BLOOD GLUCOSE TEST VI STRP: ORAL_STRIP | 12 refills | Status: DC

## 2019-03-20 MED FILL — TRUE METRIX TEST STRIP: 25 days supply | Qty: 100 | Fill #0

## 2019-03-20 MED FILL — LISINOPRIL-HCTZ 20-12.5 MG: 20-12.5 | 30 days supply | Qty: 60 | Fill #0

## 2019-03-20 MED FILL — metFORMIN HCL 500 MG TABS: 500 | 30 days supply | Qty: 120 | Fill #0

## 2019-03-20 MED FILL — ATORVASTATIN CALCIUM 40 MG: 40 | 30 days supply | Qty: 30 | Fill #0

## 2019-03-20 NOTE — Patient Instructions (Signed)
Recommendations For Diabetic/Prediabetic Patients:   -  Exercise at least 5 times a week for 30 minutes or preferably daily.  - No Smoking - Drink less than 2 drinks a day.  - Monitor your feet for sores - Have yearly Eye Exams - Recommend annual Flu vaccine  - Recommend Pneumovax and Prevnar vaccines - Shingles Vaccine (Zostavax) if over 60 y.o.  Goals:   - BMI less than 24 - Fasting sugar less than 130 or less than 150 if tapering medicines to lose weight  - Systolic BP less than 130  - Diastolic BP less than 80 - Bad LDL Cholesterol less than 70 - Triglycerides less than 150 

## 2019-03-20 NOTE — Progress Notes (Addendum)
New Patient Office Visit  Subjective:  Patient ID: Paula Serrano, female    DOB: 10-27-1960  Age: 58 y.o. MRN: [id number]  CC:  Chief Complaint  Patient presents with  . New Patient (Initial Visit)    DM/HTN    HPI Paula Serrano presents for establishment of care. [place] without primary care follow-up, however has continued to take medications as prescribed. She denies shortness of breath, headaches, chest pain or lower extremity edema   Past Medical History:  Diagnosis Date  . Asthma   . Hypertension     History reviewed. No pertinent surgical history.  History reviewed. No pertinent family history.  Social History   Socioeconomic History  . Marital status: Married    Spouse name: Not on file  . Number of children: Not on file  . Years of education: Not on file  . Highest education level: Not on file  Occupational History  . Not on file  Social Needs  . Financial resource strain: Not on file  . Food insecurity    Worry: Not on file    Inability: Not on file  . Transportation needs    Medical: Not on file    Non-medical: Not on file  Tobacco Use  . Smoking status: Never Smoker  . Smokeless tobacco: Never Used  Substance and Sexual Activity  . Alcohol use: Yes    Alcohol/week: 2.0 standard drinks    Types: 1 Cans of beer, 1 Glasses of wine per week    Comment: occasionally  . Drug use: No  . Sexual activity: Not on file  Lifestyle  . Physical activity    Days per week: Not on file    Minutes per session: Not on file  . Stress: Not on file  Relationships  . Social Herbalist on phone: Not on file    Gets together: Not on file    Attends religious service: Not on file    Active member of club or organization: Not on file    Attends meetings of clubs or organizations: Not on file    Relationship status: Not on file  . Intimate partner violence    Fear of current or ex partner: Not on file    Emotionally  abused: Not on file    Physically abused: Not on file    Forced sexual activity: Not on file  Other Topics Concern  . Not on file  Social History Narrative  . Not on file    ROS Review of Systems  Eyes: Positive for discharge.       Watery eyes due to allergies  Respiratory: Positive for shortness of breath and wheezing.   Allergic/Immunologic: Positive for environmental allergies.  All other systems reviewed and are negative.   Objective:   Today's Vitals: BP (!) 154/89 (BP Location: Right Arm, Patient Position: Sitting, Cuff Size: Large)   Pulse 75   Temp 98.6 F (37 C) (Tympanic)   Ht 5' 4"  (1.626 m)   Wt 235 lb 12.8 oz (107 kg)   SpO2 95%   BMI 40.47 kg/m   Physical Exam Vitals signs reviewed.  Constitutional:      Appearance: Normal appearance.  HENT:     Head: Normocephalic.     Right Ear: Tympanic membrane normal.     Left Ear: Tympanic membrane normal.  Eyes:     Extraocular Movements: Extraocular movements intact.     Pupils: Pupils  are equal, round, and reactive to light.  Neck:     Musculoskeletal: Normal range of motion and neck supple.  Cardiovascular:     Rate and Rhythm: Normal rate and regular rhythm.     Pulses: Normal pulses.     Heart sounds: Normal heart sounds.  Pulmonary:     Effort: Pulmonary effort is normal.     Breath sounds: Normal breath sounds.  Abdominal:     General: Bowel sounds are normal. There is distension.     Palpations: Abdomen is soft.  Skin:    General: Skin is warm.  Neurological:     Mental Status: She is alert and oriented to person, place, and time.  Psychiatric:        Mood and Affect: Mood normal.     Assessment & Plan:   Paula Serrano was seen today for new patient (initial visit).  Diagnoses and all orders for this visit:  Prediabetes New diagnosis Type 2 Diabetes A1C 6.9 -     HgB A1c  Essential hypertension Counseled on blood pressure goal of less than 130/80, low-sodium, DASH diet, medication  compliance, 150 minutes of moderate intensity exercise per week. Discussed medication compliance, adverse effects. -     lisinopril-hydrochlorothiazide (ZESTORETIC) 20-12.5 MG tablet; Take 2 tablets by mouth daily.  Pain of both hip joints she has constant pain regarding bilateral hips and knee and left foot. Pt states she is unable to complete all ADL's without being debilitated for the remainder of her day. She requires assistance from others when available.  Work on losing weight to help reduce joint pain. May alternate with heat and ice application for pain relief. May also alternate with acetaminophen and Ibuprofen as prescribed pain relief. Other alternatives include massage, acupuncture and water aerobics.  You must stay active and avoid a sedentary lifestyle. -     VITAMIN D 25 Hydroxy (Vit-D Deficiency, Fractures)  Type 2 diabetes mellitus without complication, without long-term current use of insulin (HCC) ADA recommends the following therapeutic goals for glycemic control related to A1c measurements: Goal of therapy: Less than 6.5 hemoglobin A1c.  Reference clinical practice recommendations. Foods that are high in carbohydrates are the following rice, potatoes, breads, sugars, and pastas.  Reduction in the intake (eating) will assist in lowering your blood sugars. -     CBC with Differential -     CMP14+EGFR; Future -     Lipid Panel -     TSH + free T4 -     metFORMIN (GLUCOPHAGE) 500 MG tablet; Take 2 tablets (1,000 mg total) by mouth 2 (two) times daily with a meal.  Pure hypercholesterolemia  Foods high in cholesterol or liver, fatty meats,cheese, butter avocados, nuts and seeds, chocolate and fried foods. -     atorvastatin (LIPITOR) 40 MG tablet; Take 1 tablet (40 mg total) by mouth daily. Class 2 obesity due to excess calories without serious comorbidity with body mass index (BMI) of 36.0 to 36.9 in adult Obesity is 30-39 indicating an excess in caloric intake or underlining  conditions. This may lead to other co-morbidities such as bilateral hip and knee pain that is present, was prediabetic now new diagnosis of diabetes. Lifestyle modifications of diet and exercise may reduce obesity.  Other orders -     calcium-vitamin D (OSCAL WITH D) 500-200 MG-UNIT tablet; Take 1 tablet by mouth daily with breakfast.    Outpatient Encounter Medications as of 03/20/2019  Medication Sig  . aspirin EC 81 MG tablet  Take 81 mg by mouth daily.  Marland Kitchen atorvastatin (LIPITOR) 40 MG tablet Take 1 tablet (40 mg total) by mouth daily.  . Blood Glucose Monitoring Suppl (TRUE METRIX METER) DEVI 1 each by Does not apply route 3 (three) times daily before meals.  . calcium-vitamin D (OSCAL WITH D) 500-200 MG-UNIT tablet Take 1 tablet by mouth daily with breakfast.  . glucose blood (TRUE METRIX BLOOD GLUCOSE TEST) test strip Use 3 times daily before meals  . lisinopril-hydrochlorothiazide (ZESTORETIC) 20-12.5 MG tablet Take 2 tablets by mouth daily.  . metFORMIN (GLUCOPHAGE) 500 MG tablet Take 2 tablets (1,000 mg total) by mouth 2 (two) times daily with a meal.  . TRUEPLUS LANCETS 28G MISC 1 each by Does not apply route 3 (three) times daily before meals.  . [DISCONTINUED] atorvastatin (LIPITOR) 40 MG tablet Take 1 tablet (40 mg total) by mouth daily.  . [DISCONTINUED] calcium-vitamin D (OSCAL WITH D) 500-200 MG-UNIT tablet Take 1 tablet by mouth daily with breakfast.  . [DISCONTINUED] lisinopril-hydrochlorothiazide (PRINZIDE,ZESTORETIC) 20-12.5 MG tablet Take 2 tablets by mouth daily.  . [DISCONTINUED] metFORMIN (GLUCOPHAGE) 500 MG tablet Take 1 tablet (500 mg total) by mouth 2 (two) times daily with a meal.  . Multiple Vitamins-Minerals (MULTIVITAMIN & MINERAL PO) Take 1 tablet by mouth daily.  . [DISCONTINUED] meloxicam (MOBIC) 7.5 MG tablet Take 1 tablet (7.5 mg total) by mouth daily.  . [DISCONTINUED] nitrofurantoin, macrocrystal-monohydrate, (MACROBID) 100 MG capsule Take 1 capsule (100 mg  total) by mouth 2 (two) times daily.  . [DISCONTINUED] Omega-3 Fatty Acids (FISH OIL) 1000 MG CAPS Take 1 capsule by mouth daily.  . [DISCONTINUED] tiZANidine (ZANAFLEX) 4 MG tablet Take 1 tablet (4 mg total) by mouth every 8 (eight) hours as needed for muscle spasms. (Patient not taking: Reported on 12/27/2017)   No facility-administered encounter medications on file as of 03/20/2019.     Follow-up: Return in about 4 weeks (around 04/17/2019) for HTN.   Kerin Perna, NP

## 2019-03-21 LAB — CBC WITH DIFFERENTIAL/PLATELET
Basophils Absolute: 0.1 10*3/uL (ref 0.0–0.2)
Basos: 1 %
EOS (ABSOLUTE): 0.2 10*3/uL (ref 0.0–0.4)
Eos: 3 %
Hematocrit: 40.7 % (ref 34.0–46.6)
Hemoglobin: 13.1 g/dL (ref 11.1–15.9)
Immature Grans (Abs): 0 10*3/uL (ref 0.0–0.1)
Immature Granulocytes: 0 %
Lymphocytes Absolute: 2.5 10*3/uL (ref 0.7–3.1)
Lymphs: 40 %
MCH: 29.7 pg (ref 26.6–33.0)
MCHC: 32.2 g/dL (ref 31.5–35.7)
MCV: 92 fL (ref 79–97)
Monocytes Absolute: 0.5 10*3/uL (ref 0.1–0.9)
Monocytes: 8 %
Neutrophils Absolute: 3.1 10*3/uL (ref 1.4–7.0)
Neutrophils: 48 %
Platelets: 322 10*3/uL (ref 150–450)
RBC: 4.41 x10E6/uL (ref 3.77–5.28)
RDW: 14.1 % (ref 11.7–15.4)
WBC: 6.4 10*3/uL (ref 3.4–10.8)

## 2019-03-21 LAB — LIPID PANEL
Chol/HDL Ratio: 2.9 ratio (ref 0.0–4.4)
Cholesterol, Total: 182 mg/dL (ref 100–199)
HDL: 63 mg/dL (ref >39)
LDL Calculated: 92 mg/dL (ref 0–99)
Triglycerides: 135 mg/dL (ref 0–149)
VLDL Cholesterol Cal: 27 mg/dL (ref 5–40)

## 2019-03-21 LAB — MICROALBUMIN, URINE: Microalbumin, Urine: 20.1 ug/mL

## 2019-03-21 LAB — TSH+FREE T4
Free T4: 0.96 ng/dL (ref 0.82–1.77)
TSH: 1.82 u[IU]/mL (ref 0.450–4.500)

## 2019-03-21 LAB — HIV ANTIBODY (ROUTINE TESTING W REFLEX): HIV Screen 4th Generation wRfx: NONREACTIVE

## 2019-03-21 LAB — VITAMIN D 25 HYDROXY (VIT D DEFICIENCY, FRACTURES): Vit D, 25-Hydroxy: 29.4 ng/mL — ABNORMAL LOW (ref 30.0–100.0)

## 2019-04-07 ENCOUNTER — Ambulatory Visit: Payer: Medicaid Other | Admitting: Orthopedic Surgery

## 2019-04-08 MED FILL — ?ATORVASTATIN 40MG TABLET: 40 | 30 days supply | Qty: 30 | Fill #0

## 2019-04-08 MED FILL — ?METFORMIN HCL 500MG TABLET: 500 | 30 days supply | Qty: 120 | Fill #0

## 2019-04-08 MED FILL — LISINOPRIL-HCTZ 20-12.5 MG: 20-12.5 | 30 days supply | Qty: 60 | Fill #0

## 2019-04-08 MED FILL — TRUE METRIX TEST STRIP: 25 days supply | Qty: 100 | Fill #0

## 2019-04-09 ENCOUNTER — Ambulatory Visit: Payer: Self-pay

## 2019-04-09 ENCOUNTER — Ambulatory Visit (INDEPENDENT_AMBULATORY_CARE_PROVIDER_SITE_OTHER): Payer: Self-pay | Admitting: Family

## 2019-04-09 ENCOUNTER — Encounter: Payer: Self-pay | Admitting: Family

## 2019-04-09 VITALS — Ht 64.0 in | Wt 235.0 lb

## 2019-04-09 DIAGNOSIS — M722 Plantar fascial fibromatosis: Secondary | ICD-10-CM

## 2019-04-09 DIAGNOSIS — G8929 Other chronic pain: Secondary | ICD-10-CM

## 2019-04-09 DIAGNOSIS — M170 Bilateral primary osteoarthritis of knee: Secondary | ICD-10-CM

## 2019-04-09 DIAGNOSIS — M25552 Pain in left hip: Secondary | ICD-10-CM

## 2019-04-09 DIAGNOSIS — M545 Low back pain: Secondary | ICD-10-CM

## 2019-04-09 DIAGNOSIS — M25551 Pain in right hip: Secondary | ICD-10-CM

## 2019-04-09 NOTE — Progress Notes (Signed)
Office Visit Note   Patient: Paula Serrano           Date of Birth: 11/02/1960           MRN: [id number] Visit Date: 04/09/2019              Requested by: Grayce SessionsEdwards, Michelle P, NP [address] [place],  [place] [id number] PCP: Grayce SessionsEdwards, Michelle P, NP  Chief Complaint  Patient presents with  . Lower Back - Pain  . Left Knee - Pain  . Right Knee - Pain  . Left Foot - Pain      HPI: The patient is a 58 year old woman who presents today for evaluation of multiple issues.  #1 bilateral knee pain.  This is primarily to the medial aspect of her knee.  She complains of popping and locking has had some near falls.  This is been ongoing for many years.  She relates she has had some radiographs which were revealing for osteoarthritis bilaterally.  #2 left heel pain.  Pain to the plantar aspect of her heel as well to the medial aspect of her calcaneus this has been ongoing for over a year now pain with start up worst with first weightbearing in the morning.  Gradually eases a little bit with weightbearing for a prolonged period of time has tried multiple pairs of shoewear has tried Tylenol has tried anti-inflammatories has tried ice.  Currently wearing crocs feels she is unable to weight-bear very much or even stand due to pain.  #3 bilateral hip and low back pain.  This is a bandlike pain aching from the center of her lumbar spine around to the lateral aspect of her hips.  Some in her buttock.  Denies any radicular symptoms.  No numbness no tingling no weakness no groin pain this is not made worse by ambulating.  She does relate that she feels a little bit better when leaning forward.  Often has quite a bit of stiffness after lying or sitting and feels that she needs to work to straighten her spine and stand up straight.  Assessment & Plan: Visit Diagnoses:  1. Bilateral primary osteoarthritis of knee   2. Chronic bilateral low back pain without sciatica   3. Chronic hip pain, bilateral    4. Plantar fasciitis     Plan: We will proceed with Depo-Medrol injections bilateral knees for osteoarthritis today.  We will also provide a Depo-Medrol injection of her left plantar fascia.  Will review radiographs and address her back and hip pain at next visit.  Did recommend supportive shoe wear and arch support for her plantar fascia in the long-term.  Called today 04/16/19. Will trial prednisone burst for lbp. Knees have been much better since injection. plantar fascia has failed to improve.  Follow-Up Instructions: Return in about 4 weeks (around 05/07/2019), or if symptoms worsen or fail to improve.   Right Knee Exam   Tenderness  The patient is experiencing tenderness in the medial joint line.  Range of Motion  The patient has normal right knee ROM.  Tests  Varus: negative Valgus: negative  Other  Effusion: no effusion present  Comments:  Crepitation   Left Knee Exam   Tenderness  The patient is experiencing tenderness in the medial joint line.  Range of Motion  The patient has normal left knee ROM.  Tests  Varus: negative Valgus: negative  Other  Effusion: no effusion present  Comments:  Crepitation   Right Hip Exam  Right hip  exam is normal.   Tenderness  The patient is experiencing no tenderness.   Range of Motion  The patient has normal right hip ROM.   Left Hip Exam  Left hip exam is normal.  Tenderness  The patient is experiencing no tenderness.   Range of Motion  The patient has normal left hip ROM.   Back Exam   Tenderness  The patient is experiencing no tenderness.   Range of Motion  The patient has normal back ROM.      Patient is alert, oriented, no adenopathy, well-dressed, normal affect, normal respiratory effort. The patient has point tenderness to the origin of her plantar fascia on the left.  There is no pain with lateral compression of the calcaneus.  No erythema no swelling no sign of injury to her foot.   Imaging: No results found. No images are attached to the encounter.  Labs: Lab Results  Component Value Date   HGBA1C 6.6 (A) 03/20/2019   HGBA1C 6.3 12/27/2017   HGBA1C 6.3 04/30/2017     Lab Results  Component Value Date   ALBUMIN 4.4 12/27/2017   ALBUMIN 4.6 04/30/2017   ALBUMIN 4.3 10/05/2016    No results found for: MG Lab Results  Component Value Date   VD25OH 29.4 (L) 03/20/2019    No results found for: PREALBUMIN CBC EXTENDED Latest Ref Rng & Units 03/20/2019  WBC 3.4 - 10.8 x10E3/uL 6.4  RBC 3.77 - 5.28 x10E6/uL 4.41  HGB 11.1 - 15.9 g/dL 13.1  HCT 34.0 - 46.6 % 40.7  PLT 150 - 450 x10E3/uL 322  NEUTROABS 1.4 - 7.0 x10E3/uL 3.1  LYMPHSABS 0.7 - 3.1 x10E3/uL 2.5     Body mass index is 40.34 kg/m.  Orders:  Orders Placed This Encounter  Procedures  . Large Joint Inj: bilateral knee  . Foot Inj  . XR Lumbar Spine 2-3 Views  . XR HIP UNILAT W OR W/O PELVIS 2-3 VIEWS RIGHT  . XR HIPS BILAT W OR W/O PELVIS 2V   No orders of the defined types were placed in this encounter.    Procedures: Large Joint Inj: bilateral knee on 04/09/2019 3:44 PM Indications: pain Details: 18 G 1.5 in needle, anteromedial approach Medications (Right): 5 mL lidocaine 1 %; 40 mg methylPREDNISolone acetate 40 MG/ML Medications (Left): 5 mL lidocaine 1 %; 40 mg methylPREDNISolone acetate 40 MG/ML Consent was given by the patient.   Foot Inj  Date/Time: 04/09/2019 3:44 PM Performed by: Suzan Slick, NP Authorized by: Suzan Slick, NP   Consent Given by:  Patient Site marked: the procedure site was marked   Timeout: prior to procedure the correct patient, procedure, and site was verified   Indications:  Fasciitis and pain Condition: Plantar Fasciitis   Location: left plantar fascia muscle   Prep: patient was prepped and draped in usual sterile fashion   Needle Size:  22 G Medications:  2 mL lidocaine 1 %; 40 mg methylPREDNISolone acetate 40 MG/ML Patient Tolerance:   Patient tolerated the procedure well with no immediate complications    Clinical Data: No additional findings.  ROS:  All other systems negative, except as noted in the HPI. Review of Systems  Constitutional: Negative for chills and fever.  Cardiovascular: Negative for leg swelling.  Musculoskeletal: Positive for arthralgias and back pain. Negative for joint swelling and myalgias.  Neurological: Negative for weakness and numbness.    Objective: Vital Signs: Ht 5\' 4"  (1.626 m)   Wt 235 lb (106.6  kg)   BMI 40.34 kg/m   Specialty Comments:  No specialty comments available.  PMFS History: Patient Active Problem List   Diagnosis Date Noted  . Hyperlipidemia 09/11/2015  . Type 2 diabetes mellitus without complication, without long-term current use of insulin (HCC) 09/10/2015  . Hypertension 09/10/2015  . Obesity 09/10/2015  . Arthralgia 09/10/2015   Past Medical History:  Diagnosis Date  . Asthma   . Hypertension     History reviewed. No pertinent family history.  History reviewed. No pertinent surgical history. Social History   Occupational History  . Not on file  Tobacco Use  . Smoking status: Never Smoker  . Smokeless tobacco: Never Used  Substance and Sexual Activity  . Alcohol use: Yes    Alcohol/week: 2.0 standard drinks    Types: 1 Cans of beer, 1 Glasses of wine per week    Comment: occasionally  . Drug use: No  . Sexual activity: Not on file

## 2019-04-16 MED ORDER — METHYLPREDNISOLONE ACETATE 40 MG/ML IJ SUSP
40.0000 mg | INTRAMUSCULAR | Status: AC | PRN
  Administered 2019-04-09: 16:00:00 40 mg

## 2019-04-16 MED ORDER — LIDOCAINE HCL 1 % IJ SOLN
5.0000 mL | INTRAMUSCULAR | Status: AC | PRN
  Administered 2019-04-09: 5 mL

## 2019-04-16 MED ORDER — LIDOCAINE HCL 1 % IJ SOLN
2.0000 mL | INTRAMUSCULAR | Status: AC | PRN
  Administered 2019-04-09: 16:00:00 2 mL

## 2019-04-16 MED ORDER — METHYLPREDNISOLONE ACETATE 40 MG/ML IJ SUSP
40.0000 mg | INTRAMUSCULAR | Status: AC | PRN
  Administered 2019-04-09: 40 mg via INTRA_ARTICULAR

## 2019-04-16 MED ORDER — PREDNISONE 50 MG PO TABS: ORAL_TABLET | ORAL | 0 refills | Status: DC

## 2019-04-16 MED ORDER — METHYLPREDNISOLONE ACETATE 40 MG/ML IJ SUSP
40.0000 mg | INTRAMUSCULAR | Status: AC | PRN
  Administered 2019-04-09: 16:00:00 40 mg via INTRA_ARTICULAR

## 2019-04-16 MED FILL — predniSONE 20 MG TABS: 20 | 5 days supply | Qty: 13 | Fill #0

## 2019-04-21 ENCOUNTER — Other Ambulatory Visit: Payer: Self-pay

## 2019-04-21 ENCOUNTER — Encounter (INDEPENDENT_AMBULATORY_CARE_PROVIDER_SITE_OTHER): Payer: Self-pay | Admitting: Primary Care

## 2019-04-21 ENCOUNTER — Ambulatory Visit (INDEPENDENT_AMBULATORY_CARE_PROVIDER_SITE_OTHER): Payer: Self-pay | Admitting: Primary Care

## 2019-04-21 DIAGNOSIS — I10 Essential (primary) hypertension: Secondary | ICD-10-CM

## 2019-04-21 DIAGNOSIS — M25551 Pain in right hip: Secondary | ICD-10-CM

## 2019-04-21 DIAGNOSIS — M25561 Pain in right knee: Secondary | ICD-10-CM

## 2019-04-21 DIAGNOSIS — M25562 Pain in left knee: Secondary | ICD-10-CM

## 2019-04-21 DIAGNOSIS — M25552 Pain in left hip: Secondary | ICD-10-CM

## 2019-04-21 NOTE — Progress Notes (Signed)
138/79 current

## 2019-04-28 MED FILL — predniSONE 20 MG TABS: 20 | 5 days supply | Qty: 13 | Fill #0

## 2019-04-30 NOTE — Progress Notes (Signed)
Virtual Visit via Telephone Note  I connected with Paula Serrano on 04/30/19 at  3:30 PM EDT by telephone and verified that I am speaking with the correct person using two identifiers.   I discussed the limitations, risks, security and privacy concerns of performing an evaluation and management service by telephone and the availability of in person appointments. I also discussed with the patient that there may be a patient responsible charge related to this service. The patient expressed understanding and agreed to proceed.   History of Present Illness: [name] is having a tele visit to follow up on her blood pressure previous visit restarted Zestoric 20/12.5 one daily. She voices complaints of bilateral hip pain and difficulty to walk.   Past Medical History:  Diagnosis Date  . Asthma   . Hypertension    Observations/Objective: Review of Systems  Musculoskeletal: Positive for back pain and joint pain.       Hip and knee pain  All other systems reviewed and are negative.   Assessment and Plan: Diagnoses and all orders for this visit:  Essential hypertension Bp taken today is 138/79 previous Bp 154/89 improvement continue ZESTORETIC) 20-12.5 MG tablet; Take 2 tablets by mouth daily. Discussed watching her sodium intake and she can not due very much exercising due to knees and hips . Discussed isometric exercising.  Pain of both hip joints and knees Seen by orthopedic 04/09/2019 to manage bilateral primary osteoarthritis of the knees. Chronic hip and back pain bilateral and plantar fascitis     Follow Up Instructions:    I discussed the assessment and treatment plan with the patient. The patient was provided an opportunity to ask questions and all were answered. The patient agreed with the plan and demonstrated an understanding of the instructions.   The patient was advised to call back or seek an in-person evaluation if the symptoms worsen or if the condition fails to  improve as anticipated.  I provided 12 minutes of non-face-to-face time during this encounter.   Kerin Perna, NP

## 2019-07-07 MED FILL — ?ATORVASTATIN 40MG TABLET: 40 | 30 days supply | Qty: 30 | Fill #1

## 2019-07-07 MED FILL — ?METFORMIN HCL 500MG TABLET: 500 | 15 days supply | Qty: 60 | Fill #1

## 2019-07-07 MED FILL — LISINOPRIL-HCTZ 20-12.5 MG: 20-12.5 | 30 days supply | Qty: 60 | Fill #1

## 2019-08-18 MED FILL — LISINOPRIL-HCTZ 20-12.5 MG: 20-12.5 | 30 days supply | Qty: 60 | Fill #2

## 2019-08-18 MED FILL — ?ATORVASTATIN 40MG TABL: 40 | 30 days supply | Qty: 30 | Fill #2

## 2019-08-26 ENCOUNTER — Other Ambulatory Visit (INDEPENDENT_AMBULATORY_CARE_PROVIDER_SITE_OTHER): Payer: Self-pay | Admitting: Primary Care

## 2019-08-26 ENCOUNTER — Other Ambulatory Visit: Payer: Self-pay

## 2019-08-26 DIAGNOSIS — I10 Essential (primary) hypertension: Secondary | ICD-10-CM

## 2019-08-26 DIAGNOSIS — E119 Type 2 diabetes mellitus without complications: Secondary | ICD-10-CM

## 2019-08-26 DIAGNOSIS — E7800 Pure hypercholesterolemia, unspecified: Secondary | ICD-10-CM

## 2019-08-26 MED ORDER — ATORVASTATIN CALCIUM 40 MG PO TABS: 40.0000 mg | ORAL_TABLET | Freq: Every day | ORAL | 0 refills | Status: DC

## 2019-08-26 MED ORDER — LISINOPRIL-HYDROCHLOROTHIAZIDE 20-12.5 MG PO TABS: 2.0000 | ORAL_TABLET | Freq: Every day | ORAL | 0 refills | Status: DC

## 2019-08-26 MED ORDER — METFORMIN HCL 500 MG PO TABS: 1000.0000 mg | ORAL_TABLET | Freq: Two times a day (BID) | ORAL | 0 refills | Status: DC

## 2019-08-26 MED FILL — ?METFORMIN HCL 500MG TABLET: 500 | 30 days supply | Qty: 120 | Fill #0

## 2019-09-25 MED FILL — metroNIDAZOLE 500 MG TABS: 500 | 7 days supply | Qty: 21 | Fill #0

## 2019-09-26 MED FILL — CIPROFLOXACIN HCL 500 MG TA: 500 | 7 days supply | Qty: 21 | Fill #0

## 2019-09-26 MED FILL — LISINOPRIL-HCTZ 20-12.5 MG: 20-12.5 | 30 days supply | Qty: 60 | Fill #0

## 2019-10-07 ENCOUNTER — Other Ambulatory Visit (INDEPENDENT_AMBULATORY_CARE_PROVIDER_SITE_OTHER): Payer: Self-pay | Admitting: Primary Care

## 2019-10-07 DIAGNOSIS — E7800 Pure hypercholesterolemia, unspecified: Secondary | ICD-10-CM

## 2019-10-07 MED FILL — ?METFORMIN HCL 500MG TABLET: 500 | 15 days supply | Qty: 60 | Fill #1

## 2019-10-07 MED FILL — ATORVASTATIN CALCIUM 40 MG: 40 | 90 days supply | Qty: 90 | Fill #0

## 2019-10-16 ENCOUNTER — Ambulatory Visit: Payer: Self-pay | Attending: Internal Medicine

## 2019-10-18 ENCOUNTER — Ambulatory Visit: Payer: Self-pay | Attending: Internal Medicine

## 2019-10-18 DIAGNOSIS — Z23 Encounter for immunization: Secondary | ICD-10-CM

## 2019-11-03 ENCOUNTER — Other Ambulatory Visit (INDEPENDENT_AMBULATORY_CARE_PROVIDER_SITE_OTHER): Payer: Self-pay | Admitting: Primary Care

## 2019-11-03 DIAGNOSIS — E119 Type 2 diabetes mellitus without complications: Secondary | ICD-10-CM

## 2019-11-03 MED FILL — LISINOPRIL-HCTZ 20-12.5 MG: 20-12.5 | 30 days supply | Qty: 60 | Fill #1

## 2019-11-11 ENCOUNTER — Ambulatory Visit: Payer: Self-pay

## 2019-11-11 ENCOUNTER — Other Ambulatory Visit (INDEPENDENT_AMBULATORY_CARE_PROVIDER_SITE_OTHER): Payer: Self-pay | Admitting: Primary Care

## 2019-11-11 ENCOUNTER — Ambulatory Visit: Payer: Self-pay | Attending: Internal Medicine

## 2019-11-11 DIAGNOSIS — I10 Essential (primary) hypertension: Secondary | ICD-10-CM

## 2019-11-11 DIAGNOSIS — E7800 Pure hypercholesterolemia, unspecified: Secondary | ICD-10-CM

## 2019-11-11 DIAGNOSIS — E119 Type 2 diabetes mellitus without complications: Secondary | ICD-10-CM

## 2019-11-11 DIAGNOSIS — Z23 Encounter for immunization: Secondary | ICD-10-CM

## 2019-11-13 ENCOUNTER — Other Ambulatory Visit (INDEPENDENT_AMBULATORY_CARE_PROVIDER_SITE_OTHER): Payer: Self-pay | Admitting: Primary Care

## 2019-11-13 DIAGNOSIS — I10 Essential (primary) hypertension: Secondary | ICD-10-CM

## 2019-11-13 DIAGNOSIS — E119 Type 2 diabetes mellitus without complications: Secondary | ICD-10-CM

## 2019-11-13 DIAGNOSIS — E7800 Pure hypercholesterolemia, unspecified: Secondary | ICD-10-CM

## 2019-11-13 MED ORDER — LISINOPRIL-HYDROCHLOROTHIAZIDE 20-12.5 MG PO TABS: 2.0000 | ORAL_TABLET | Freq: Every day | ORAL | 0 refills | Status: DC

## 2019-11-13 MED ORDER — ATORVASTATIN CALCIUM 40 MG PO TABS: 40.0000 mg | ORAL_TABLET | Freq: Every day | ORAL | 0 refills | Status: DC

## 2019-11-13 MED ORDER — METFORMIN HCL 500 MG PO TABS: 1000.0000 mg | ORAL_TABLET | Freq: Two times a day (BID) | ORAL | 0 refills | Status: DC

## 2019-11-13 MED FILL — METFORMIN HCL 500 MG TABS: 500 | 30 days supply | Qty: 120 | Fill #0

## 2019-12-08 MED FILL — LISINOPRIL-HCTZ 20-12.5 MG: 20-12.5 | 30 days supply | Qty: 60 | Fill #2

## 2019-12-10 ENCOUNTER — Other Ambulatory Visit (INDEPENDENT_AMBULATORY_CARE_PROVIDER_SITE_OTHER): Payer: Self-pay | Admitting: Primary Care

## 2019-12-10 DIAGNOSIS — E119 Type 2 diabetes mellitus without complications: Secondary | ICD-10-CM

## 2019-12-10 MED ORDER — "INSULIN SYRINGE 28G X 1/2"" 0.5 ML MISC": 1.0000 | Freq: Three times a day (TID) | 5 refills | Status: DC

## 2019-12-10 MED ORDER — TRUE METRIX AIR GLUCOSE METER W/DEVICE KIT: 1.0000 | PACK | Freq: Three times a day (TID) | 0 refills | Status: DC

## 2019-12-10 MED ORDER — TRUE METRIX BLOOD GLUCOSE TEST VI STRP: ORAL_STRIP | 12 refills | Status: DC

## 2019-12-10 MED FILL — TRUEPLUS SYR 1ML 31GX5/16: 31G X 5/16" | 33 days supply | Qty: 100 | Fill #0

## 2019-12-10 MED FILL — !TRUE METRIX BLOOD GLUCOSE: 1 days supply | Qty: 1 | Fill #0

## 2019-12-10 MED FILL — TRUE METRIX GLUCOSE TEST ST: 33 days supply | Qty: 100 | Fill #0

## 2019-12-26 ENCOUNTER — Ambulatory Visit (INDEPENDENT_AMBULATORY_CARE_PROVIDER_SITE_OTHER): Payer: Self-pay | Admitting: Primary Care

## 2019-12-26 ENCOUNTER — Other Ambulatory Visit: Payer: Self-pay

## 2019-12-26 ENCOUNTER — Encounter (INDEPENDENT_AMBULATORY_CARE_PROVIDER_SITE_OTHER): Payer: Self-pay | Admitting: Primary Care

## 2019-12-26 VITALS — BP 133/85 | HR 75 | Temp 97.3°F | Ht 64.0 in | Wt 227.4 lb

## 2019-12-26 DIAGNOSIS — I10 Essential (primary) hypertension: Secondary | ICD-10-CM

## 2019-12-26 DIAGNOSIS — Z1231 Encounter for screening mammogram for malignant neoplasm of breast: Secondary | ICD-10-CM

## 2019-12-26 DIAGNOSIS — E119 Type 2 diabetes mellitus without complications: Secondary | ICD-10-CM

## 2019-12-26 DIAGNOSIS — Z1211 Encounter for screening for malignant neoplasm of colon: Secondary | ICD-10-CM

## 2019-12-26 DIAGNOSIS — M25551 Pain in right hip: Secondary | ICD-10-CM

## 2019-12-26 DIAGNOSIS — Z6836 Body mass index (BMI) 36.0-36.9, adult: Secondary | ICD-10-CM

## 2019-12-26 DIAGNOSIS — M25552 Pain in left hip: Secondary | ICD-10-CM

## 2019-12-26 DIAGNOSIS — E6609 Other obesity due to excess calories: Secondary | ICD-10-CM

## 2019-12-26 LAB — POCT CBG (FASTING - GLUCOSE)-MANUAL ENTRY: Glucose Fasting, POC: 95 mg/dL (ref 70–99)

## 2019-12-26 LAB — POCT GLYCOSYLATED HEMOGLOBIN (HGB A1C): Hemoglobin A1C: 6.2 % — AB (ref 4.0–5.6)

## 2019-12-26 NOTE — Patient Instructions (Signed)

## 2019-12-26 NOTE — Progress Notes (Signed)
Paula Serrano is a 59 y.o. female who presents for an follow up evaluation of Type 2 diabetes mellitus.  Current symptoms/problems include none Current diabetic medications include metformin 530m twice daily  The patient was initially diagnosed with Type 2 diabetes mellitus based on the following criteria: Per ADA guidelines  Current monitoring regimen: home blood tests - 3 times weekly Home blood sugar records: fasting range: 89-120 Any episodes of hypoglycemia? no  Known diabetic complications: cardiovascular disease Cardiovascular risk factors: advanced age (older than 582for men, 662for women), diabetes mellitus, hypertension and obesity (BMI >= 30 kg/m2) Eye exam current (within one year): yes Weight trend: stable Prior visit with CDE: no Current diet: in general, a "healthy" diet   Current exercise: walking Medication Compliance?  Yes   Is She on ACE inhibitor or angiotensin II receptor blocker?  Yes  lisinopril (generic)   Review of Systems  Musculoskeletal:       Bilateral knee pain  All other systems reviewed and are negative.   .  Objective:    BP 133/85 (BP Location: Right Arm, Patient Position: Sitting, Cuff Size: Large)    Pulse 75    Temp (!) 97.3 F (36.3 C) (Tympanic)    Ht 5' 4"  (1.626 m)    Wt 227 lb 6.4 oz (103.1 kg)    SpO2 97%    BMI 39.03 kg/m   Physical Exam  General: No apparent distress. Obese female  Eyes: Extraocular eye movements intact, pupils equal and round. Neck: Supple, trachea midline. Thyroid: No enlargement, mobile without fixation, no tenderness. Cardiovascular: Regular rhythm and rate, no murmur, normal radial pulses. Respiratory: Normal respiratory effort, clear to auscultation. Gastrointestinal: Normal pitch active bowel sounds, nontender abdomen without distention or appreciable hepatomegaly. Neurologic: , deep tendon reflexes , no tremor, Musculoskeletal: Normal muscle tone, no tenderness on palpation of tibia, no excessive  thoracic kyphosis. Skin: Appropriate warmth, no visible rash. Mental status: Alert, conversant, speech clear, thought logical, appropriate mood and affect, no hallucinations or delusions evident. Hematologic/lymphatic: No cervical adenopathy, no visible ecchymoses.  Lab Review Glucose (mg/dL)  Date Value  12/27/2017 109 (H)  04/30/2017 100 (H)   Glucose, Bld (mg/dL)  Date Value  10/05/2016 89  06/27/2016 85  09/10/2015 91   CO2 (mmol/L)  Date Value  12/27/2017 26  04/30/2017 26  10/05/2016 29   BUN (mg/dL)  Date Value  12/27/2017 13  04/30/2017 17  10/05/2016 17  06/27/2016 16  09/10/2015 12   Creat (mg/dL)  Date Value  10/05/2016 0.90  06/27/2016 0.90  09/10/2015 0.78   Creatinine, Ser (mg/dL)  Date Value  12/27/2017 0.94  04/30/2017 1.02 (H)      Assessment:    Diabetes Mellitus type II, under good control.   GKadejahwas seen today for hypertension.  D Type 2 diabetes mellitus without complication, without long-term current use of insulin ([place] ADA recommends the following therapeutic goals for glycemic control related to A1c measurements: Goal of therapy: Less than 6.5 hemoglobin A1c. Foods that are high in carbohydrates are the following rice, potatoes, breads, sugars, and pastas.  Reduction in the intake (eating) will assist in lowering your blood sugars. -     HgB A1c 6.2 improved from 6.6 - 9 months ago  -     Glucose (CBG), Fasting -     CBC with Differential -     Lipid Panel  Class 2 obesity due to excess calories without serious comorbidity with body mass index (  BMI) of 36.0 to 36.9 in adult Obesity is 30-39 indicating an excess in caloric intake or underlining conditions. This may lead to other co-morbidities. Lifestyle modifications of diet and exercise may reduce obesity.  -     CMP14+EGFR  Pain of both hip joints -     Rheumatoid Arthritis Profile -     Sedimentation Rate  Screening for colon cancer -     Fecal occult blood,  imunochemical; Future  Encounter for screening mammogram for malignant neoplasm of breast Patient completed application for BCCP while in clinic and application has and faxed to [facility]. Patient aware that [facility] will contact her directly to schedule appointment. -     MM Digital Screening; Future       Plan:    Essential hypertension 1.  Rx changes: none 2.  Education: Reviewed ABCs of diabetes management (respective goals in parentheses):  A1C (<7), blood pressure (<130/80), and cholesterol (LDL <100). 3. Discussed pathophysiology of DM; difference between type 1 and type 2 DM. 4. CHO counting diet discussed.  Reviewed CHO amount in various foods and how to read nutrition labels.  Discussed recommended serving sizes.  5.  Recommend check BG 1  times a day 6.  Recommended increase physical activity - goal is 150 minutes per week 7. Follow up: 3 months

## 2019-12-28 LAB — CMP14+EGFR
ALT: 12 IU/L (ref 0–32)
AST: 15 IU/L (ref 0–40)
Albumin/Globulin Ratio: 1.5 (ref 1.2–2.2)
Albumin: 4.5 g/dL (ref 3.8–4.9)
Alkaline Phosphatase: 79 IU/L (ref 48–121)
BUN/Creatinine Ratio: 10 (ref 9–23)
BUN: 9 mg/dL (ref 6–24)
Bilirubin Total: 0.8 mg/dL (ref 0.0–1.2)
CO2: 25 mmol/L (ref 20–29)
Calcium: 9.9 mg/dL (ref 8.7–10.2)
Chloride: 101 mmol/L (ref 96–106)
Creatinine, Ser: 0.88 mg/dL (ref 0.57–1.00)
GFR calc Af Amer: 83 mL/min/{1.73_m2} (ref >59)
GFR calc non Af Amer: 72 mL/min/{1.73_m2} (ref >59)
Globulin, Total: 3 g/dL (ref 1.5–4.5)
Glucose: 96 mg/dL (ref 65–99)
Potassium: 4.1 mmol/L (ref 3.5–5.2)
Sodium: 141 mmol/L (ref 134–144)
Total Protein: 7.5 g/dL (ref 6.0–8.5)

## 2019-12-28 LAB — CBC WITH DIFFERENTIAL/PLATELET
Basophils Absolute: 0.1 10*3/uL (ref 0.0–0.2)
Basos: 1 %
EOS (ABSOLUTE): 0.2 10*3/uL (ref 0.0–0.4)
Eos: 3 %
Hematocrit: 40.8 % (ref 34.0–46.6)
Hemoglobin: 13.3 g/dL (ref 11.1–15.9)
Immature Grans (Abs): 0 10*3/uL (ref 0.0–0.1)
Immature Granulocytes: 0 %
Lymphocytes Absolute: 2.8 10*3/uL (ref 0.7–3.1)
Lymphs: 43 %
MCH: 30.4 pg (ref 26.6–33.0)
MCHC: 32.6 g/dL (ref 31.5–35.7)
MCV: 93 fL (ref 79–97)
Monocytes Absolute: 0.5 10*3/uL (ref 0.1–0.9)
Monocytes: 7 %
Neutrophils Absolute: 3 10*3/uL (ref 1.4–7.0)
Neutrophils: 46 %
Platelets: 288 10*3/uL (ref 150–450)
RBC: 4.37 x10E6/uL (ref 3.77–5.28)
RDW: 14.7 % (ref 11.7–15.4)
WBC: 6.6 10*3/uL (ref 3.4–10.8)

## 2019-12-28 LAB — LIPID PANEL
Chol/HDL Ratio: 3 ratio (ref 0.0–4.4)
Cholesterol, Total: 160 mg/dL (ref 100–199)
HDL: 54 mg/dL (ref >39)
LDL Chol Calc (NIH): 79 mg/dL (ref 0–99)
Triglycerides: 157 mg/dL — ABNORMAL HIGH (ref 0–149)
VLDL Cholesterol Cal: 27 mg/dL (ref 5–40)

## 2019-12-28 LAB — SEDIMENTATION RATE: Sed Rate: 60 mm/hr — ABNORMAL HIGH (ref 0–40)

## 2019-12-28 LAB — RHEUMATOID ARTHRITIS PROFILE
Cyclic Citrullin Peptide Ab: 14 units (ref 0–19)
Rheumatoid fact SerPl-aCnc: <10 IU/mL (ref 0.0–13.9)

## 2019-12-30 ENCOUNTER — Other Ambulatory Visit (INDEPENDENT_AMBULATORY_CARE_PROVIDER_SITE_OTHER): Payer: Self-pay | Admitting: Primary Care

## 2019-12-30 DIAGNOSIS — E782 Mixed hyperlipidemia: Secondary | ICD-10-CM

## 2019-12-30 MED ORDER — ATORVASTATIN CALCIUM 20 MG PO TABS: 20.0000 mg | ORAL_TABLET | Freq: Every day | ORAL | 3 refills | Status: DC

## 2019-12-30 NOTE — Progress Notes (Signed)
Sent in atorvastatin 20mg  at bedtime spoke with patient

## 2019-12-31 MED FILL — ?ATORVASTATIN 20 MG TABLET: 20 | 30 days supply | Qty: 30 | Fill #0

## 2020-01-07 MED FILL — METFORMIN HCL 500 MG TABS: 500 | 15 days supply | Qty: 60 | Fill #1

## 2020-01-07 MED FILL — LISINOPRIL-HCTZ 20-12.5 MG: 20-12.5 | 30 days supply | Qty: 60 | Fill #0

## 2020-01-09 ENCOUNTER — Other Ambulatory Visit (INDEPENDENT_AMBULATORY_CARE_PROVIDER_SITE_OTHER): Payer: Self-pay

## 2020-01-09 DIAGNOSIS — Z1211 Encounter for screening for malignant neoplasm of colon: Secondary | ICD-10-CM

## 2020-01-10 LAB — FECAL OCCULT BLOOD, IMMUNOCHEMICAL: Fecal Occult Bld: NEGATIVE

## 2020-01-23 ENCOUNTER — Telehealth: Payer: Self-pay

## 2020-01-23 NOTE — Telephone Encounter (Signed)
Patient calls and requests BCCCP appointment for mammogram. Attempted to contact patient, left message on voicemail, requesting a return call.

## 2020-02-11 ENCOUNTER — Other Ambulatory Visit: Payer: Self-pay

## 2020-02-11 DIAGNOSIS — N644 Mastodynia: Secondary | ICD-10-CM

## 2020-02-13 ENCOUNTER — Other Ambulatory Visit (INDEPENDENT_AMBULATORY_CARE_PROVIDER_SITE_OTHER): Payer: Self-pay | Admitting: Primary Care

## 2020-02-13 DIAGNOSIS — E119 Type 2 diabetes mellitus without complications: Secondary | ICD-10-CM

## 2020-02-13 DIAGNOSIS — I10 Essential (primary) hypertension: Secondary | ICD-10-CM

## 2020-02-14 MED ORDER — LISINOPRIL-HYDROCHLOROTHIAZIDE 20-12.5 MG PO TABS: 2.0000 | ORAL_TABLET | Freq: Every day | ORAL | 0 refills | Status: DC

## 2020-02-14 MED ORDER — METFORMIN HCL 500 MG PO TABS: 1000.0000 mg | ORAL_TABLET | Freq: Two times a day (BID) | ORAL | 0 refills | Status: DC

## 2020-02-16 MED FILL — LISINOPRIL-HCTZ 20-12.5 MG: 20-12.5 | 30 days supply | Qty: 60 | Fill #0

## 2020-02-16 MED FILL — METFORMIN HCL 500 MG TABS: 500 | 30 days supply | Qty: 120 | Fill #0

## 2020-02-26 ENCOUNTER — Other Ambulatory Visit: Payer: Self-pay

## 2020-02-26 ENCOUNTER — Ambulatory Visit: Payer: Medicaid Other

## 2020-02-26 ENCOUNTER — Ambulatory Visit
Admission: RE | Admit: 2020-02-26 | Discharge: 2020-02-26 | Disposition: A | Discharge status: Home / Self Care | Payer: No Typology Code available for payment source | Source: Ambulatory Visit | Attending: Obstetrics and Gynecology | Admitting: Obstetrics and Gynecology

## 2020-02-26 ENCOUNTER — Ambulatory Visit: Payer: Self-pay | Admitting: *Deleted

## 2020-02-26 VITALS — BP 148/86 | Temp 97.1°F | Wt 226.9 lb

## 2020-02-26 DIAGNOSIS — N644 Mastodynia: Secondary | ICD-10-CM

## 2020-02-26 DIAGNOSIS — Z1239 Encounter for other screening for malignant neoplasm of breast: Secondary | ICD-10-CM

## 2020-02-26 NOTE — Progress Notes (Signed)
[name] is a 59 y.o. female who presents to [facility] clinic today with complaint of right breast pain x 1 year that comes and goes. Patient rates the pain at a 3-4 out of 10.    Pap Smear: Pap not smear completed today. Last Pap smear was in 2008 at a clinic in [place] and was normal per patient. Per patient has no history of an abnormal Pap smear. Patient has a history of a hysterectomy in 2009 due to fibroids and AUB. Patient doesn't need any further Pap smears due to patients history of a hysterectomy for benign reasons per BCCCP and ASCCP guidelines. Last Pap smear result is not available in Epic.   Physical exam: Breasts Breasts symmetrical. No skin abnormalities bilateral breasts. No nipple retraction bilateral breasts. No nipple discharge bilateral breasts. No lymphadenopathy. No lumps palpated bilateral breasts. No complaints of pain or tenderness on exam.       Pelvic/Bimanual Pap is not indicated today per BCCCP guidelines.   Smoking History: Patient has never smoked.  Patient Navigation: Patient education provided. Access to services provided for patient through BCCCP program.   Colorectal Cancer Screening: Per patient had a colonoscopy completed over 10 years ago. No complaints today.    Breast and Cervical Cancer Risk Assessment: Patient does not have family history of breast cancer, known genetic mutations, or radiation treatment to the chest before age 75. Patient does not have history of cervical dysplasia, immunocompromised, or DES exposure in-utero.  Risk Assessment    Risk Scores      02/26/2020   Last edited by: Narda Rutherford, LPN   5-year risk: 1.1 %   Lifetime risk: 5.5 %          A: BCCCP exam without pap smear Complaint of right breast pain.  P: Referred patient to the [facility] for a diagnostic mammogram. Appointment scheduled Thursday, February 26, 2020 at 1240.  Priscille Heidelberg, RN 02/26/2020 1:05 PM

## 2020-02-26 NOTE — Patient Instructions (Addendum)
Explained breast self awareness with Deneen Harts. Patient did not need a Pap smear today due to patient has a history of a hysterectomy for benign reasons. Let patient know that she doesn't need any further Pap smears due to her history of a hysterectomy for benign reasons. Referred patient to the [facility] for a diagnostic mammogram. Appointment scheduled Thursday, February 26, 2020 at 1240. Patient aware of appointment and will be there. Deneen Harts verbalized understanding.  Joven Mom, Kathaleen Maser, RN 1:05 PM

## 2020-02-27 MED FILL — LISINOPRIL-HCTZ 20-12.5 MG: 20-12.5 | 30 days supply | Qty: 60 | Fill #0

## 2020-02-27 MED FILL — METFORMIN HCL 500 MG TABS: 500 | 30 days supply | Qty: 120 | Fill #0

## 2020-04-01 MED FILL — LISINOPRIL-HCTZ 20-12.5 MG: 20-12.5 | 30 days supply | Qty: 60 | Fill #1

## 2020-04-01 MED FILL — ?METFORMIN HCL 500MG TABL: 500 | 15 days supply | Qty: 60 | Fill #1

## 2020-04-08 ENCOUNTER — Encounter (INDEPENDENT_AMBULATORY_CARE_PROVIDER_SITE_OTHER): Payer: Self-pay | Admitting: Primary Care

## 2020-04-08 ENCOUNTER — Other Ambulatory Visit (INDEPENDENT_AMBULATORY_CARE_PROVIDER_SITE_OTHER): Payer: Self-pay | Admitting: Primary Care

## 2020-04-08 DIAGNOSIS — I10 Essential (primary) hypertension: Secondary | ICD-10-CM

## 2020-04-08 DIAGNOSIS — E782 Mixed hyperlipidemia: Secondary | ICD-10-CM

## 2020-04-08 DIAGNOSIS — E119 Type 2 diabetes mellitus without complications: Secondary | ICD-10-CM

## 2020-04-09 ENCOUNTER — Ambulatory Visit (INDEPENDENT_AMBULATORY_CARE_PROVIDER_SITE_OTHER): Payer: Self-pay | Admitting: Family

## 2020-04-09 ENCOUNTER — Other Ambulatory Visit (INDEPENDENT_AMBULATORY_CARE_PROVIDER_SITE_OTHER): Payer: Self-pay | Admitting: Primary Care

## 2020-04-09 DIAGNOSIS — M722 Plantar fascial fibromatosis: Secondary | ICD-10-CM

## 2020-04-09 DIAGNOSIS — M170 Bilateral primary osteoarthritis of knee: Secondary | ICD-10-CM

## 2020-04-09 MED ORDER — METFORMIN HCL 500 MG PO TABS: 1000.0000 mg | ORAL_TABLET | Freq: Two times a day (BID) | ORAL | 1 refills | Status: DC

## 2020-04-09 MED ORDER — ATORVASTATIN CALCIUM 20 MG PO TABS: 20.0000 mg | ORAL_TABLET | Freq: Every day | ORAL | 3 refills | Status: DC

## 2020-04-09 MED ORDER — METFORMIN HCL 500 MG PO TABS: 1000.0000 mg | ORAL_TABLET | Freq: Two times a day (BID) | ORAL | 0 refills | Status: DC

## 2020-04-09 MED ORDER — LISINOPRIL-HYDROCHLOROTHIAZIDE 20-12.5 MG PO TABS: 2.0000 | ORAL_TABLET | Freq: Every day | ORAL | 0 refills | Status: DC

## 2020-04-09 MED ORDER — LISINOPRIL-HYDROCHLOROTHIAZIDE 20-12.5 MG PO TABS: 2.0000 | ORAL_TABLET | Freq: Every day | ORAL | 1 refills | Status: DC

## 2020-04-09 MED FILL — ATORVASTATIN CALCIUM 20 MG: 20 | 30 days supply | Qty: 30 | Fill #0

## 2020-04-09 NOTE — Telephone Encounter (Signed)
Sent to PCP ?

## 2020-04-16 ENCOUNTER — Encounter: Payer: Self-pay | Admitting: Family

## 2020-04-16 DIAGNOSIS — M722 Plantar fascial fibromatosis: Secondary | ICD-10-CM

## 2020-04-16 DIAGNOSIS — M170 Bilateral primary osteoarthritis of knee: Secondary | ICD-10-CM

## 2020-04-16 MED ORDER — LIDOCAINE HCL 1 % IJ SOLN
5.0000 mL | INTRAMUSCULAR | Status: AC | PRN
  Administered 2020-04-16: 5 mL

## 2020-04-16 MED ORDER — METHYLPREDNISOLONE ACETATE 40 MG/ML IJ SUSP
40.0000 mg | INTRAMUSCULAR | Status: AC | PRN
  Administered 2020-04-16: 40 mg via INTRA_ARTICULAR

## 2020-04-16 MED ORDER — LIDOCAINE HCL 1 % IJ SOLN
2.0000 mL | INTRAMUSCULAR | Status: AC | PRN
  Administered 2020-04-16: 2 mL

## 2020-04-16 MED ORDER — METHYLPREDNISOLONE ACETATE 40 MG/ML IJ SUSP
40.0000 mg | INTRAMUSCULAR | Status: AC | PRN
  Administered 2020-04-16: 40 mg

## 2020-04-16 NOTE — Progress Notes (Signed)
Office Visit Note   Patient: Paula Serrano           Date of Birth: 08/20/1960           MRN: [id number] Visit Date: 04/09/2020              Requested by: Grayce Sessions, NP [address] [place],  [place] [address] PCP: Grayce Sessions, NP  Chief Complaint  Patient presents with  . Left Foot - Follow-up    PF injection 04/09/19  . Left Knee - Follow-up    S/p bilateral knee injection 04/08/20  . Right Knee - Follow-up      HPI: The patient is a 59 year old woman who presents today in follow-up for multiple issues.  She has been seen previously for bilateral knee osteoarthritis as well as plantar fasciitis of the left heel.  She was last seen for these a year ago.  Reports good interval relief with Depo-Medrol injections of the knees and heel injection unfortunately these did gradually return she has been dealing with this now for many months and has deferred returning to the office.   #1 bilateral knee pain.  Pain to the medial joint lines this is associated with some popping mechanical symptoms.  Unchanged over the last several years.    #2 left heel pain.  Pain to the plantar aspect of her heel as well to the medial aspect of her calcaneus this has been ongoing for 2 years.  Complains of start up pain this gradually eases over the course of the day has attempted supportive shoewear changing her inserts using Tylenol and anti-inflammatories as well as ice without relief    Assessment & Plan: Visit Diagnoses:  1. Bilateral primary osteoarthritis of knee   2. Plantar fasciitis     Plan: Depo-Medrol injections bilateral knees today.  Patient tolerated well.  Depo-Medrol injection of the left plantar fascia.  She will follow-up on an as-needed basis    Follow-Up Instructions: No follow-ups on file.   Right Knee Exam   Tenderness  The patient is experiencing tenderness in the medial joint line.  Range of Motion  The patient has normal right knee ROM.  Tests    Varus: negative Valgus: negative  Other  Effusion: no effusion present  Comments:  Crepitation   Left Knee Exam   Tenderness  The patient is experiencing tenderness in the medial joint line.  Range of Motion  The patient has normal left knee ROM.  Tests  Varus: negative Valgus: negative  Other  Effusion: no effusion present  Comments:  Crepitation   Right Hip Exam  Right hip exam is normal.   Tenderness  The patient is experiencing no tenderness.   Range of Motion  The patient has normal right hip ROM.   Left Hip Exam  Left hip exam is normal.  Tenderness  The patient is experiencing no tenderness.   Range of Motion  The patient has normal left hip ROM.   Back Exam   Tenderness  The patient is experiencing no tenderness.   Range of Motion  The patient has normal back ROM.      Patient is alert, oriented, no adenopathy, well-dressed, normal affect, normal respiratory effort. The patient has point tenderness to the origin of her plantar fascia on the left.  There is no pain with lateral compression of the calcaneus.  No erythema no swelling no sign of injury to her foot.  Imaging: No results found. No images are  attached to the encounter.  Labs: Lab Results  Component Value Date   HGBA1C 6.2 (A) 12/26/2019   HGBA1C 6.6 (A) 03/20/2019   HGBA1C 6.3 12/27/2017   ESRSEDRATE 60 (H) 12/26/2019     Lab Results  Component Value Date   ALBUMIN 4.5 12/26/2019   ALBUMIN 4.4 12/27/2017   ALBUMIN 4.6 04/30/2017    No results found for: MG Lab Results  Component Value Date   VD25OH 29.4 (L) 03/20/2019    No results found for: PREALBUMIN CBC EXTENDED Latest Ref Rng & Units 12/26/2019 03/20/2019  WBC 3.4 - 10.8 x10E3/uL 6.6 6.4  RBC 3.77 - 5.28 x10E6/uL 4.37 4.41  HGB 11.1 - 15.9 g/dL 50.9 32.6  HCT 71.2 - 45.8 % 40.8 40.7  PLT 150 - 450 x10E3/uL 288 322  NEUTROABS 1 - 7 x10E3/uL 3.0 3.1  LYMPHSABS 0 - 3 x10E3/uL 2.8 2.5     There is no  height or weight on file to calculate BMI.  Orders:  No orders of the defined types were placed in this encounter.  No orders of the defined types were placed in this encounter.    Procedures: Large Joint Inj: bilateral knee on 04/16/2020 10:23 AM Indications: pain Details: 18 G 1.5 in needle, anteromedial approach Medications (Right): 5 mL lidocaine 1 %; 40 mg methylPREDNISolone acetate 40 MG/ML Medications (Left): 5 mL lidocaine 1 %; 40 mg methylPREDNISolone acetate 40 MG/ML Consent was given by the patient.   Foot Inj  Date/Time: 04/16/2020 10:23 AM Performed by: Adonis Huguenin, NP Authorized by: Adonis Huguenin, NP   Consent Given by:  Patient Site marked: the procedure site was marked   Timeout: prior to procedure the correct patient, procedure, and site was verified   Indications:  Fasciitis and pain Condition: Plantar Fasciitis   Location: left plantar fascia muscle   Prep: patient was prepped and draped in usual sterile fashion   Needle Size:  22 G Medications:  2 mL lidocaine 1 %; 40 mg methylPREDNISolone acetate 40 MG/ML Patient Tolerance:  Patient tolerated the procedure well with no immediate complications    Clinical Data: No additional findings.  ROS:  All other systems negative, except as noted in the HPI. Review of Systems  Constitutional: Negative for chills and fever.  Cardiovascular: Negative for leg swelling.  Musculoskeletal: Positive for arthralgias and back pain. Negative for joint swelling and myalgias.  Neurological: Negative for weakness and numbness.    Objective: Vital Signs: There were no vitals taken for this visit.  Specialty Comments:  No specialty comments available.  PMFS History: Patient Active Problem List   Diagnosis Date Noted  . Hyperlipidemia 09/11/2015  . Type 2 diabetes mellitus without complication, without long-term current use of insulin (HCC) 09/10/2015  . Hypertension 09/10/2015  . Obesity 09/10/2015  .  Arthralgia 09/10/2015   Past Medical History:  Diagnosis Date  . Arthritis   . Asthma   . Diabetes mellitus without complication (HCC)   . Hypertension     History reviewed. No pertinent family history.  Past Surgical History:  Procedure Laterality Date  . ABDOMINAL HYSTERECTOMY     Social History   Occupational History  . Not on file  Tobacco Use  . Smoking status: Never Smoker  . Smokeless tobacco: Never Used  Vaping Use  . Vaping Use: Never used  Substance and Sexual Activity  . Alcohol use: Yes    Alcohol/week: 2.0 standard drinks    Types: 1 Glasses of wine, 1 Cans  of beer per week    Comment: occasionally  . Drug use: No  . Sexual activity: Yes    Birth control/protection: Surgical

## 2020-04-27 MED FILL — LISINOPRIL-HCTZ 20-12.5 MG: 20-12.5 | 90 days supply | Qty: 180 | Fill #0

## 2020-04-27 MED FILL — METFORMIN HCL 500 MG TABS: 500 | 90 days supply | Qty: 360 | Fill #0

## 2020-04-30 ENCOUNTER — Ambulatory Visit (INDEPENDENT_AMBULATORY_CARE_PROVIDER_SITE_OTHER): Payer: No Typology Code available for payment source

## 2020-09-06 MED FILL — LISINOPRIL-HCTZ 20-12.5 MG: 20-12.5 | 90 days supply | Qty: 180 | Fill #1

## 2020-09-06 MED FILL — ATORVASTATIN CALCIUM 20 MG: 20 | 90 days supply | Qty: 90 | Fill #1

## 2020-09-06 MED FILL — METFORMIN HCL 500 MG TABS: 500 | 30 days supply | Qty: 120 | Fill #0

## 2021-01-28 ENCOUNTER — Other Ambulatory Visit: Payer: Self-pay

## 2021-01-28 ENCOUNTER — Other Ambulatory Visit (INDEPENDENT_AMBULATORY_CARE_PROVIDER_SITE_OTHER): Payer: Self-pay | Admitting: Primary Care

## 2021-01-28 DIAGNOSIS — I10 Essential (primary) hypertension: Secondary | ICD-10-CM

## 2021-01-28 MED ORDER — METFORMIN HCL 500 MG PO TABS
ORAL_TABLET | Freq: Two times a day (BID) | ORAL | 0 refills | Status: DC
  Filled 2021-01-28 – 2021-01-31 (×2): qty 120, 30d supply, fill #0

## 2021-01-28 MED ORDER — LISINOPRIL-HYDROCHLOROTHIAZIDE 20-12.5 MG PO TABS
2.0000 | ORAL_TABLET | Freq: Every day | ORAL | 0 refills | Status: DC
  Filled 2021-01-28 – 2021-03-14 (×2): qty 60, 30d supply, fill #0

## 2021-01-28 MED FILL — Atorvastatin Calcium Tab 20 MG (Base Equivalent): ORAL | 90 days supply | Qty: 90 | Fill #0 | Status: CN

## 2021-01-28 MED FILL — Metformin HCl Tab 500 MG: ORAL | Qty: 180 | Fill #0 | Status: CN

## 2021-01-28 NOTE — Telephone Encounter (Signed)
Attempted to call patient to schedule appointment- left message to call office for appointment. Courtesy RF #[phone] sent to pharmacy

## 2021-01-28 NOTE — Telephone Encounter (Signed)
Attempted to call patient to schedule appointment- left message to call office for appointment. Courtesy RF #[phone] sent to pharmacy

## 2021-01-31 ENCOUNTER — Other Ambulatory Visit: Payer: Self-pay

## 2021-02-01 ENCOUNTER — Other Ambulatory Visit: Payer: Self-pay

## 2021-02-03 ENCOUNTER — Other Ambulatory Visit: Payer: Self-pay

## 2021-02-09 ENCOUNTER — Other Ambulatory Visit: Payer: Self-pay

## 2021-02-11 ENCOUNTER — Other Ambulatory Visit: Payer: Self-pay

## 2021-03-07 IMAGING — MG DIGITAL DIAGNOSTIC BILAT W/ TOMO W/ CAD
8 of 15 series · 8 of 40 positions shown · non-contrast
Comparison: None.

CLINICAL DATA: Diffuse right breast pain

EXAM:
DIGITAL DIAGNOSTIC BILATERAL MAMMOGRAM WITH TOMO AND CAD

[R MLO synth-2D]
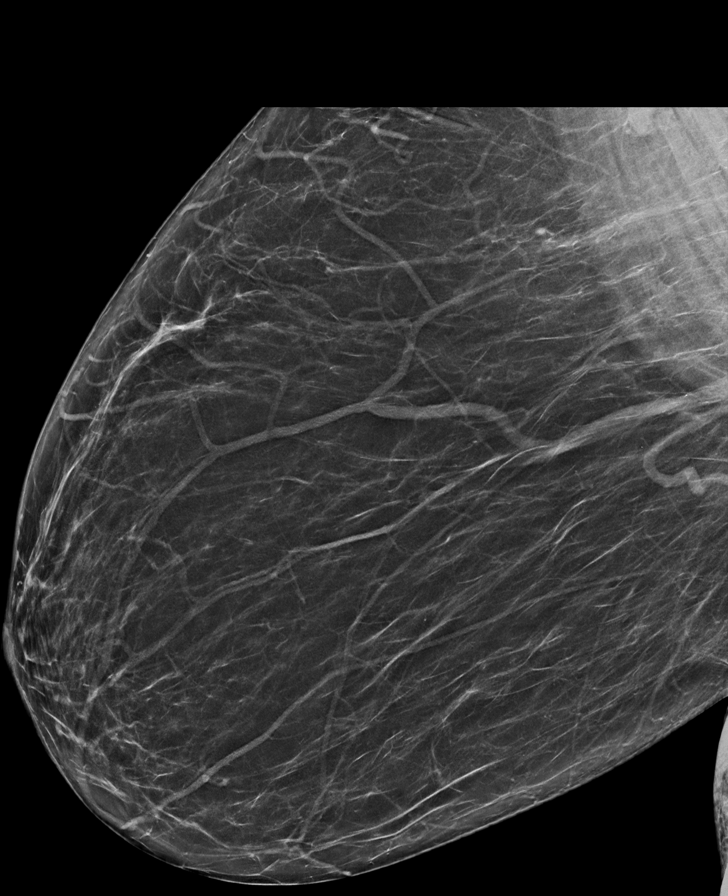

[L CC synth-2D (1 of 2)]
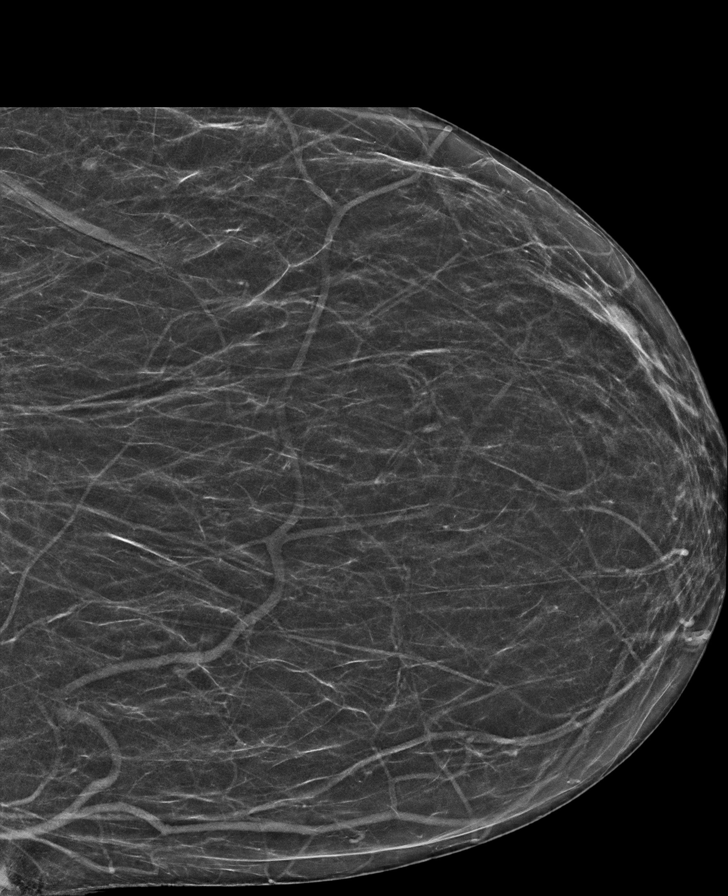

[L MLO synth-2D (1 of 3)]
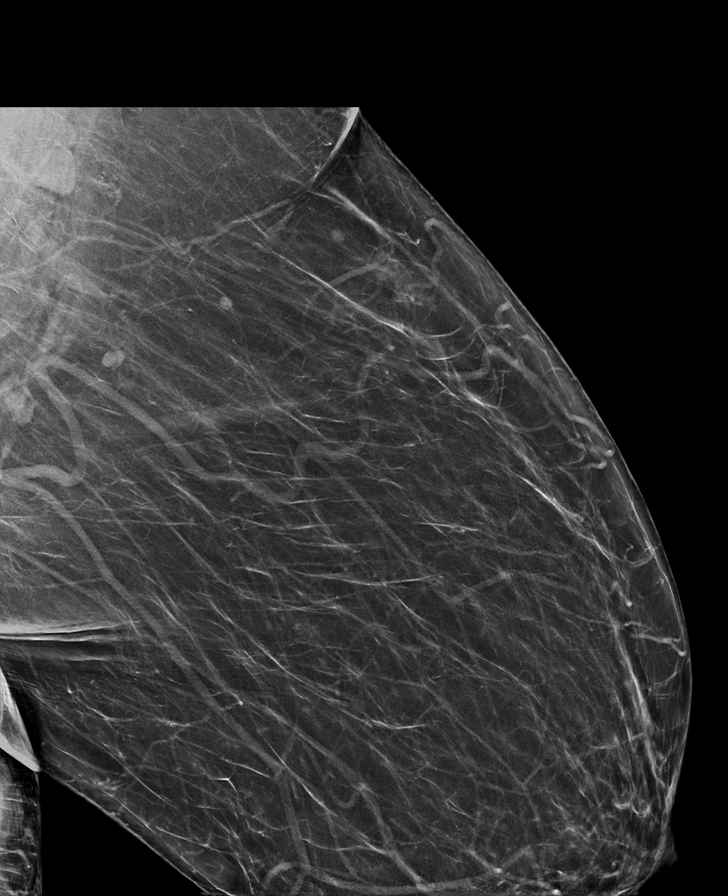

[R CC synth-2D]
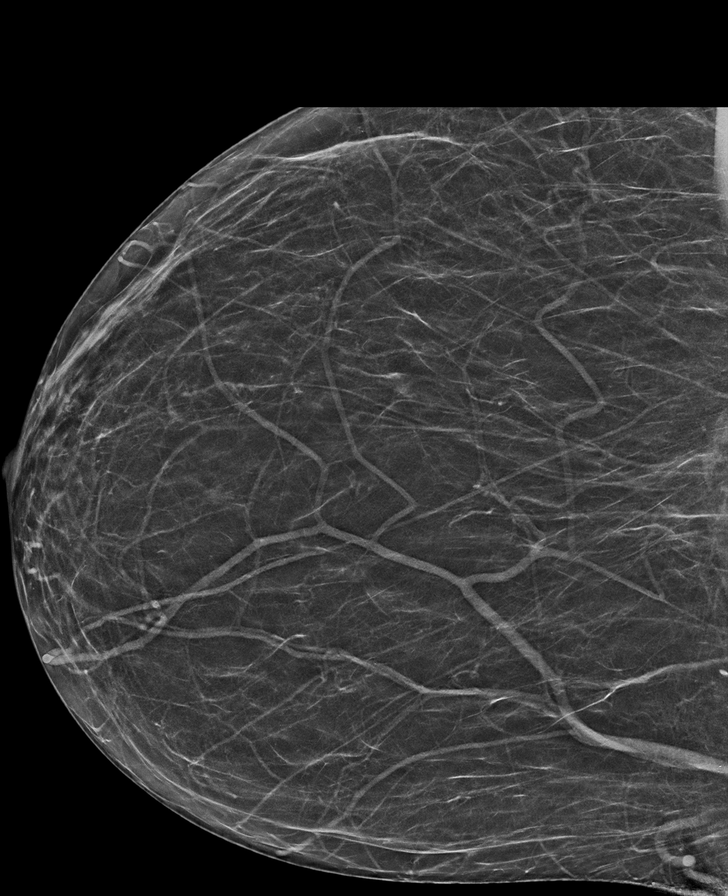

[L CC synth-2D (2 of 2)]
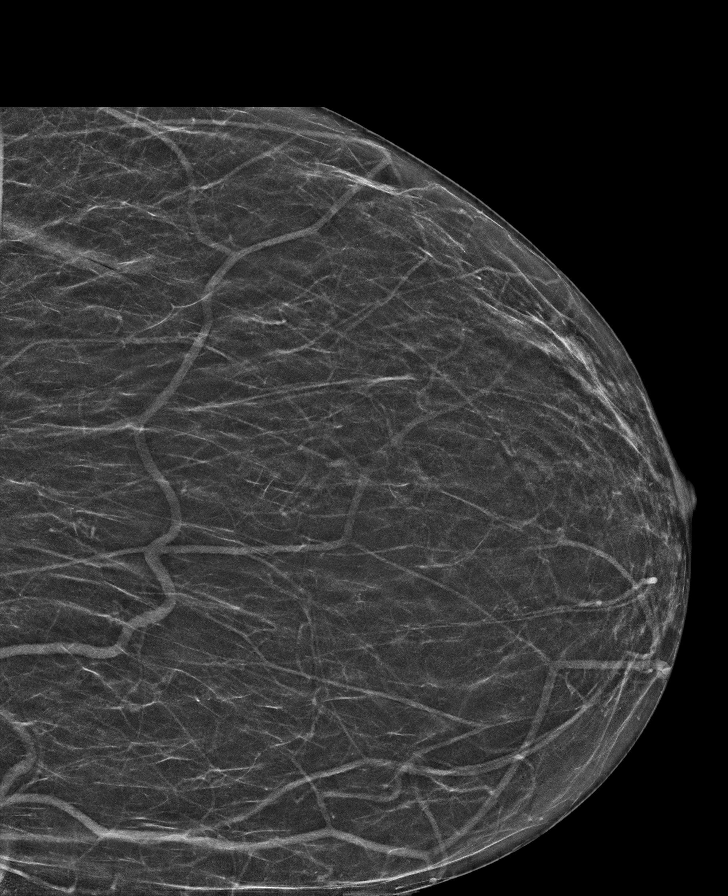

[L MLO synth-2D (2 of 3)]
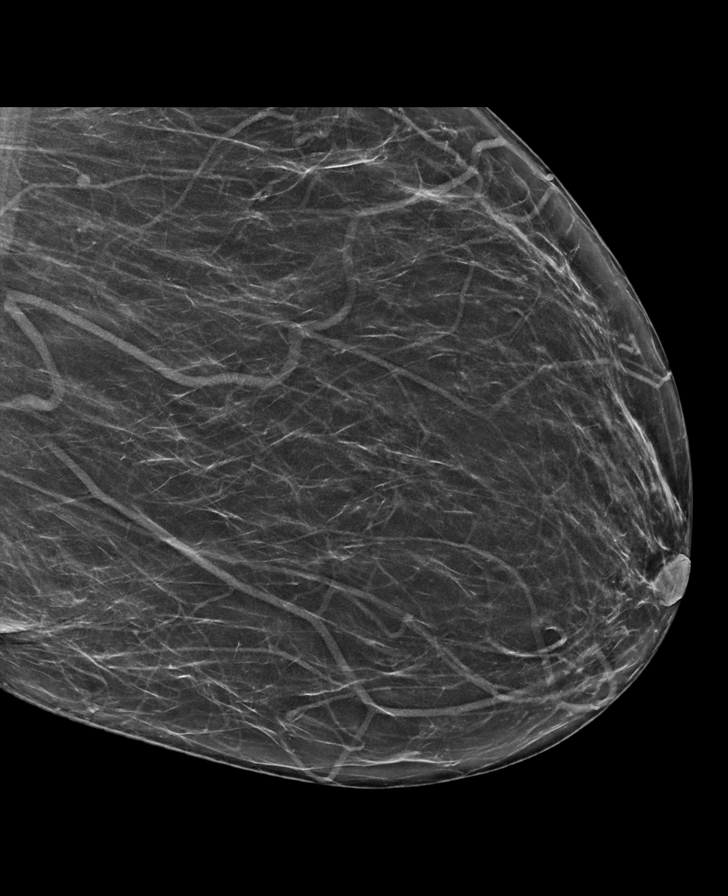

[L MLO synth-2D (3 of 3)]
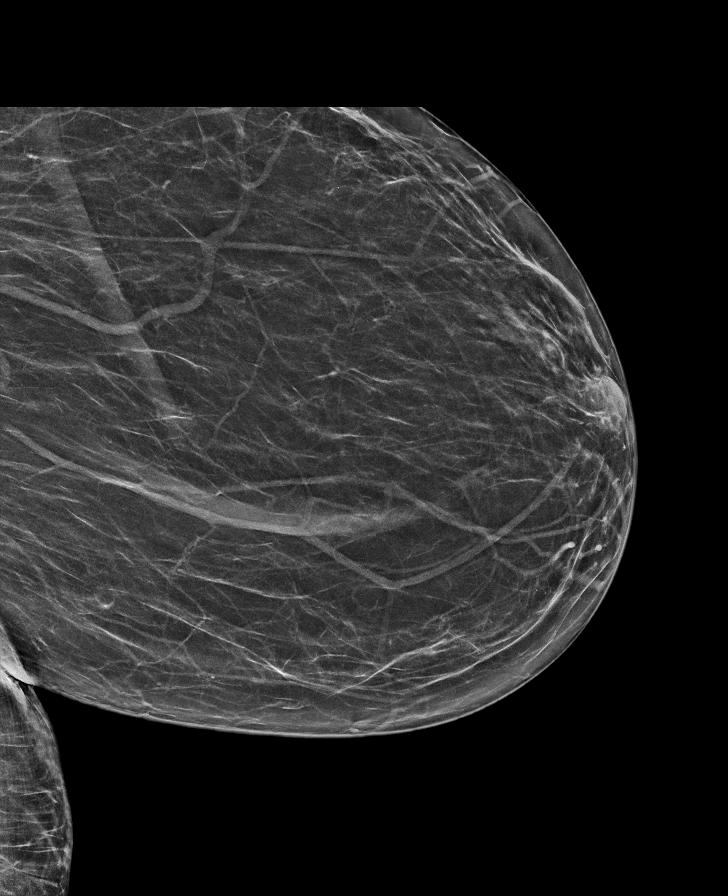

[L MLO tomo · tomo slice 49/72.0]
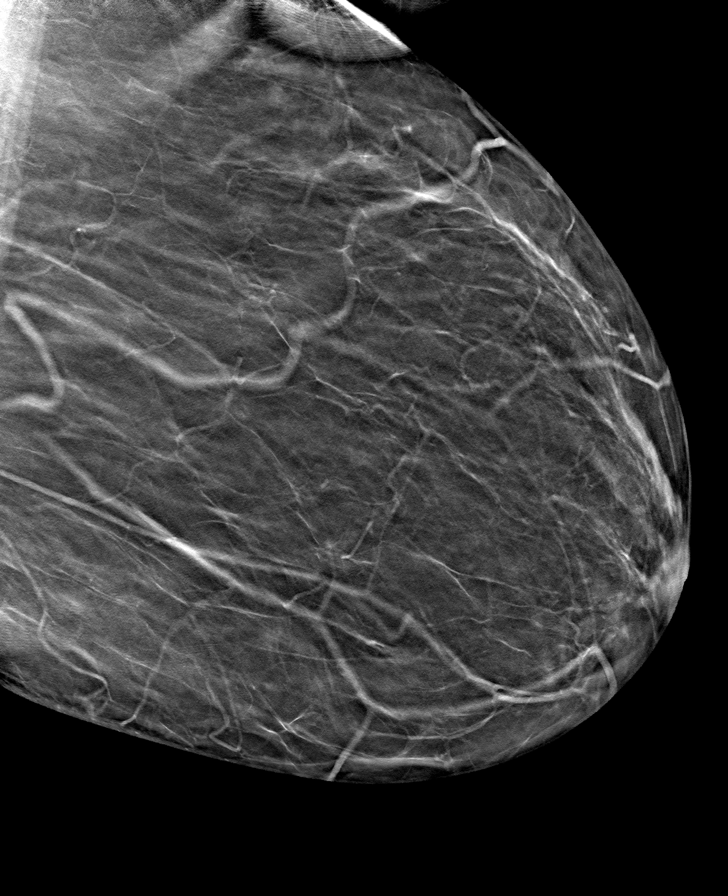

[8 of 40 positions shown; findings below may reference images not displayed]

ACR Breast Density Category b: There are scattered areas of
fibroglandular density.
FINDINGS: No suspicious masses, calcifications, or distortion are seen in
either breast.

Mammographic images were processed with CAD.
IMPRESSION: No mammographic evidence of malignancy.

RECOMMENDATION:
Treatment of the patient's diffuse right breast pain should be based
on clinical and physical exam given lack of imaging findings. Annual
screening mammography.

I have discussed the findings and recommendations with the patient.
If applicable, a reminder letter will be sent to the patient
regarding the next appointment.

BI-RADS CATEGORY  1: Negative.

## 2021-03-14 ENCOUNTER — Other Ambulatory Visit: Payer: Self-pay

## 2022-01-18 ENCOUNTER — Encounter (INDEPENDENT_AMBULATORY_CARE_PROVIDER_SITE_OTHER): Payer: Self-pay | Admitting: Primary Care

## 2022-01-18 ENCOUNTER — Other Ambulatory Visit: Payer: Self-pay

## 2022-01-18 ENCOUNTER — Ambulatory Visit (INDEPENDENT_AMBULATORY_CARE_PROVIDER_SITE_OTHER): Payer: Self-pay | Admitting: Primary Care

## 2022-01-18 VITALS — BP 134/81 | HR 64 | Temp 97.8°F | Ht 64.0 in | Wt 225.4 lb

## 2022-01-18 DIAGNOSIS — E782 Mixed hyperlipidemia: Secondary | ICD-10-CM

## 2022-01-18 DIAGNOSIS — I10 Essential (primary) hypertension: Secondary | ICD-10-CM

## 2022-01-18 DIAGNOSIS — E119 Type 2 diabetes mellitus without complications: Secondary | ICD-10-CM

## 2022-01-18 DIAGNOSIS — Z23 Encounter for immunization: Secondary | ICD-10-CM

## 2022-01-18 DIAGNOSIS — E6609 Other obesity due to excess calories: Secondary | ICD-10-CM

## 2022-01-18 DIAGNOSIS — M2550 Pain in unspecified joint: Secondary | ICD-10-CM

## 2022-01-18 DIAGNOSIS — Z760 Encounter for issue of repeat prescription: Secondary | ICD-10-CM

## 2022-01-18 DIAGNOSIS — Z6838 Body mass index (BMI) 38.0-38.9, adult: Secondary | ICD-10-CM

## 2022-01-18 LAB — POCT GLYCOSYLATED HEMOGLOBIN (HGB A1C): Hemoglobin A1C: 6.4 % — AB (ref 4.0–5.6)

## 2022-01-18 MED ORDER — METFORMIN HCL ER 500 MG PO TB24
500.0000 mg | ORAL_TABLET | Freq: Every day | ORAL | 1 refills | Status: DC
  Filled 2022-01-18: qty 90, 90d supply, fill #0
  Filled 2022-04-27: qty 90, 90d supply, fill #1

## 2022-01-18 MED ORDER — LISINOPRIL-HYDROCHLOROTHIAZIDE 20-12.5 MG PO TABS
2.0000 | ORAL_TABLET | Freq: Every day | ORAL | 1 refills | Status: DC
  Filled 2022-01-18: qty 60, 30d supply, fill #0
  Filled 2022-04-27: qty 60, 30d supply, fill #1

## 2022-01-18 MED ORDER — CELECOXIB 100 MG PO CAPS
100.0000 mg | ORAL_CAPSULE | Freq: Two times a day (BID) | ORAL | 1 refills | Status: DC
  Filled 2022-01-18: qty 60, 30d supply, fill #0

## 2022-01-18 NOTE — Progress Notes (Signed)
Subjective:  Patient ID: Paula Serrano, female    DOB: 07/26/1960  Age: 61 y.o. MRN: [id number]  CC: Follow-up (DM/HTN)   HPI [name] presents for follow-up of diabetes. Patient does not check blood sugar at home. Hypertension- Patient has No headache, No chest pain, No abdominal pain - No Nausea, No new weakness tingling or numbness, No Cough - shortness of breath . [place]. Requesting medication refills.  Compliant with meds - Yes Checking CBGs? No  Fasting avg -   Postprandial average -  Exercising regularly? - Yes Watching carbohydrate intake? - Yes Neuropathy ? - No Hypoglycemic events - No  - Recovers with :   Pertinent ROS:  Polyuria - No Polydipsia - No Vision problems - No  Medications as noted below. Taking them regularly without complication/adverse reaction being reported today.   History Paula Serrano has a past medical history of Arthritis, Asthma, Diabetes mellitus without complication ([place]), and Hypertension.   She has a past surgical history that includes Abdominal hysterectomy.   Her family history is not on file.She reports that she has never smoked. She has never used smokeless tobacco. She reports current alcohol use of about 2.0 standard drinks of alcohol per week. She reports that she does not use drugs.  Current Outpatient Medications on File Prior to Visit  Medication Sig Dispense Refill   aspirin EC 81 MG tablet Take 81 mg by mouth daily.     Blood Glucose Monitoring Suppl (TRUE METRIX AIR GLUCOSE METER) w/Device KIT 1 Device by Does not apply route in the morning, at noon, and at bedtime. 1 kit 0   calcium-vitamin D (OSCAL WITH D) 500-200 MG-UNIT tablet Take 1 tablet by mouth daily with breakfast. 30 tablet 3   glucose blood (TRUE METRIX BLOOD GLUCOSE TEST) test strip Use 3 times daily before meals 100 each 12   Multiple Vitamins-Minerals (MULTIVITAMIN & MINERAL PO) Take 1 tablet by mouth daily.      TRUEPLUS LANCETS 28G MISC 1 each by Does not apply route 3 (three) times daily before meals. 100 each 12   No current facility-administered medications on file prior to visit.    ROS Comprehensive ROS Pertinent positive and negative noted in HPI    Objective:  BP 134/81   Pulse 64   Temp 97.8 F (36.6 C) (Oral)   Ht _0  (1.626 m)   Wt 225 lb 6.4 oz (102.2 kg)   SpO2 98%   BMI 38.69 kg/m   BP Readings from Last 3 Encounters:  01/18/22 134/81  02/26/20 (!) 148/86  12/26/19 133/85    Wt Readings from Last 3 Encounters:  01/18/22 225 lb 6.4 oz (102.2 kg)  02/26/20 226 lb 14.4 oz (102.9 kg)  12/26/19 227 lb 6.4 oz (103.1 kg)    Physical Exam General: No apparent distress.Obese female  Eyes: Extraocular eye movements intact, pupils equal and round. Neck: Supple, trachea midline. Thyroid: No enlargement, mobile without fixation, no tenderness. Cardiovascular: Regular rhythm and rate, no murmur, normal radial pulses. Respiratory: Normal respiratory effort, clear to auscultation. Gastrointestinal: Normal pitch active bowel sounds, nontender abdomen without distention or appreciable hepatomegaly. Musculoskeletal: Normal muscle tone, tenderness on palpation of bilateral knees , no excessive thoracic kyphosis. Skin: Appropriate warmth, no visible rash. Mental status: Alert, conversant, speech clear, thought logical, appropriate mood and affect, no hallucinations or delusions evident. Hematologic/lymphatic: No cervical adenopathy, no visible ecchymoses.  Lab Results  Component Value Date  HGBA1C 6.4 (A) 01/18/2022   HGBA1C 6.2 (A) 12/26/2019   HGBA1C 6.6 (A) 03/20/2019    Lab Results  Component Value Date   WBC 4.5 01/19/2022   HGB 13.0 01/19/2022   HCT 39.4 01/19/2022   PLT 196 01/19/2022   GLUCOSE 100 (H) 01/19/2022   CHOL 212 (H) 01/19/2022   TRIG 146 01/19/2022   HDL 46 01/19/2022   LDLCALC 140 (H) 01/19/2022   ALT 13 01/19/2022   AST 21 01/19/2022   NA 142  01/19/2022   K 4.0 01/19/2022   CL 101 01/19/2022   CREATININE 0.81 01/19/2022   BUN 15 01/19/2022   CO2 26 01/19/2022   TSH 1.820 03/20/2019   HGBA1C 6.4 (A) 01/18/2022   MICROALBUR 27.6 09/10/2015     Assessment & Plan:   Diagnoses and all orders for this visit: Paula Serrano was seen today for follow-up.  Diagnoses and all orders for this visit:  Need for shingles vaccine -     Varicella-zoster vaccine IM (Shingrix)  Type 2 diabetes mellitus without complication, without long-term current use of insulin (HCC) -     HgB A1c 6.4 Monitor foods that are high in carbohydrates are the following rice, potatoes, breads, sugars, and pastas.  Reduction in the intake (eating) will assist in lowering your blood sugars.  -     metformin (FORTAMET) 500 MG (OSM) 24 hr tablet; Take 1 tablet (500 mg total) by mouth daily with breakfast.  Essential hypertension BP goal - <  140/90 Explained that having normal blood pressure is the goal and medications are helping to get to goal and maintain normal blood pressure. DIET: Limit salt intake, read nutrition labels to check salt content, limit fried and high fatty foods  Avoid using multisymptom OTC cold preparations that generally contain sudafed which can rise BP. Consult with pharmacist on best cold relief products to use for persons with HTN EXERCISE Discussed incorporating exercise such as walking - 30 minutes most days of the week and can do in 10 minute intervals    -     lisinopril-hydrochlorothiazide (ZESTORETIC) 20-12.5 MG tablet; Take 2 tablets by mouth daily.  Mixed hyperlipidemia  Healthy lifestyle diet of fruits vegetables fish nuts whole grains and low saturated fat . Foods high in cholesterol or liver, fatty meats,cheese, butter avocados, nuts and seeds, chocolate and fried foods. Further lipid  Medication refill -     lisinopril-hydrochlorothiazide (ZESTORETIC) 20-12.5 MG tablet; Take 2 tablets by mouth daily.  Class 2 obesity due to  excess calories without serious comorbidity with body mass index (BMI) of 38.0 to 38.9 in adult Obesity is 30-39 indicating an excess in caloric intake or underlining conditions. This may lead to other co-morbidities. Lifestyle modifications of diet and exercise may reduce obesity.     Arthralgia, unspecified joint -     celecoxib (CELEBREX) 100 MG capsule; Take 1 capsule (100 mg total) by mouth 2 (two) times daily.       I have discontinued Paula Serrano's metFORMIN and metFORMIN. I am also having her start on metFORMIN and celecoxib. Additionally, I am having her maintain her aspirin EC, Multiple Vitamins-Minerals (MULTIVITAMIN & MINERAL PO), TRUEplus Lancets 28G, calcium-vitamin D, True Metrix Air Glucose Meter, True Metrix Blood Glucose Test, and lisinopril-hydrochlorothiazide.  Meds ordered this encounter  Medications   lisinopril-hydrochlorothiazide (ZESTORETIC) 20-12.5 MG tablet    Sig: Take 2 tablets by mouth daily.    Dispense:  90 tablet    Refill:  1  Patient Note: Please call office for appointment    Order Specific Question:   Supervising Provider    Answer:   Tresa Garter [[id number]]   metFORMIN (GLUCOPHAGE-XR) 500 MG 24 hr tablet    Sig: Take 1 tablet (500 mg total) by mouth daily with breakfast.    Dispense:  90 tablet    Refill:  1    Order Specific Question:   Supervising Provider    Answer:   Tresa Garter [[id number]]   celecoxib (CELEBREX) 100 MG capsule    Sig: Take 1 capsule (100 mg total) by mouth 2 (two) times daily.    Dispense:  180 capsule    Refill:  1    Order Specific Question:   Supervising Provider    Answer:   Tresa Garter [[id number]]     Follow-up:    The above assessment and management plan was discussed with the patient. The patient verbalized understanding of and has agreed to the management plan. Patient is aware to call the clinic if symptoms fail to improve or worsen. Patient is aware when to return to the clinic for a  follow-up visit. Patient educated on when it is appropriate to go to the emergency department.   Juluis Mire, NP-C

## 2022-01-19 ENCOUNTER — Other Ambulatory Visit (INDEPENDENT_AMBULATORY_CARE_PROVIDER_SITE_OTHER): Payer: Self-pay

## 2022-01-19 DIAGNOSIS — E782 Mixed hyperlipidemia: Secondary | ICD-10-CM

## 2022-01-19 DIAGNOSIS — E6609 Other obesity due to excess calories: Secondary | ICD-10-CM

## 2022-01-19 DIAGNOSIS — E119 Type 2 diabetes mellitus without complications: Secondary | ICD-10-CM

## 2022-01-19 DIAGNOSIS — I10 Essential (primary) hypertension: Secondary | ICD-10-CM

## 2022-01-20 ENCOUNTER — Other Ambulatory Visit: Payer: Self-pay

## 2022-01-20 LAB — CBC WITH DIFFERENTIAL/PLATELET
Basophils Absolute: 0 10*3/uL (ref 0.0–0.2)
Basos: 1 %
EOS (ABSOLUTE): 0.1 10*3/uL (ref 0.0–0.4)
Eos: 3 %
Hematocrit: 39.4 % (ref 34.0–46.6)
Hemoglobin: 13 g/dL (ref 11.1–15.9)
Immature Grans (Abs): 0 10*3/uL (ref 0.0–0.1)
Immature Granulocytes: 0 %
Lymphocytes Absolute: 1.8 10*3/uL (ref 0.7–3.1)
Lymphs: 41 %
MCH: 31.5 pg (ref 26.6–33.0)
MCHC: 33 g/dL (ref 31.5–35.7)
MCV: 95 fL (ref 79–97)
Monocytes Absolute: 0.4 10*3/uL (ref 0.1–0.9)
Monocytes: 8 %
Neutrophils Absolute: 2.1 10*3/uL (ref 1.4–7.0)
Neutrophils: 47 %
Platelets: 196 10*3/uL (ref 150–450)
RBC: 4.13 x10E6/uL (ref 3.77–5.28)
RDW: 14.2 % (ref 11.7–15.4)
WBC: 4.5 10*3/uL (ref 3.4–10.8)

## 2022-01-20 LAB — CMP14+EGFR
ALT: 13 IU/L (ref 0–32)
AST: 21 IU/L (ref 0–40)
Albumin/Globulin Ratio: 1.4 (ref 1.2–2.2)
Albumin: 4.1 g/dL (ref 3.8–4.8)
Alkaline Phosphatase: 61 IU/L (ref 44–121)
BUN/Creatinine Ratio: 19 (ref 12–28)
BUN: 15 mg/dL (ref 8–27)
Bilirubin Total: 0.4 mg/dL (ref 0.0–1.2)
CO2: 26 mmol/L (ref 20–29)
Calcium: 9.3 mg/dL (ref 8.7–10.3)
Chloride: 101 mmol/L (ref 96–106)
Creatinine, Ser: 0.81 mg/dL (ref 0.57–1.00)
Globulin, Total: 3 g/dL (ref 1.5–4.5)
Glucose: 100 mg/dL — ABNORMAL HIGH (ref 70–99)
Potassium: 4 mmol/L (ref 3.5–5.2)
Sodium: 142 mmol/L (ref 134–144)
Total Protein: 7.1 g/dL (ref 6.0–8.5)
eGFR: 83 mL/min/{1.73_m2} (ref >59)

## 2022-01-20 LAB — LIPID PANEL
Chol/HDL Ratio: 4.6 ratio — ABNORMAL HIGH (ref 0.0–4.4)
Cholesterol, Total: 212 mg/dL — ABNORMAL HIGH (ref 100–199)
HDL: 46 mg/dL (ref >39)
LDL Chol Calc (NIH): 140 mg/dL — ABNORMAL HIGH (ref 0–99)
Triglycerides: 146 mg/dL (ref 0–149)
VLDL Cholesterol Cal: 26 mg/dL (ref 5–40)

## 2022-01-23 ENCOUNTER — Other Ambulatory Visit: Payer: Self-pay | Admitting: Nurse Practitioner

## 2022-01-23 DIAGNOSIS — E782 Mixed hyperlipidemia: Secondary | ICD-10-CM

## 2022-01-23 MED ORDER — ATORVASTATIN CALCIUM 40 MG PO TABS
40.0000 mg | ORAL_TABLET | Freq: Every day | ORAL | 0 refills | Status: DC
  Filled 2022-01-23: qty 30, 30d supply, fill #0

## 2022-01-25 ENCOUNTER — Other Ambulatory Visit: Payer: Self-pay

## 2022-02-01 ENCOUNTER — Other Ambulatory Visit: Payer: Self-pay

## 2022-04-13 ENCOUNTER — Ambulatory Visit (INDEPENDENT_AMBULATORY_CARE_PROVIDER_SITE_OTHER): Payer: Self-pay

## 2022-04-13 DIAGNOSIS — Z23 Encounter for immunization: Secondary | ICD-10-CM

## 2022-04-14 ENCOUNTER — Encounter (INDEPENDENT_AMBULATORY_CARE_PROVIDER_SITE_OTHER): Payer: Self-pay | Admitting: Primary Care

## 2022-04-27 ENCOUNTER — Other Ambulatory Visit: Payer: Self-pay

## 2022-05-12 ENCOUNTER — Other Ambulatory Visit (HOSPITAL_COMMUNITY): Payer: Self-pay

## 2022-05-12 ENCOUNTER — Other Ambulatory Visit: Payer: Self-pay

## 2022-05-12 MED ORDER — ALBUTEROL SULFATE HFA 108 (90 BASE) MCG/ACT IN AERS
2.0000 | INHALATION_SPRAY | RESPIRATORY_TRACT | 2 refills | Status: DC | PRN
  Filled 2022-05-12: qty 6.7, 17d supply, fill #0
  Filled 2023-04-09: qty 6.7, 17d supply, fill #1

## 2022-05-15 ENCOUNTER — Other Ambulatory Visit: Payer: Self-pay

## 2022-05-16 ENCOUNTER — Other Ambulatory Visit: Payer: Self-pay

## 2022-07-20 ENCOUNTER — Ambulatory Visit (INDEPENDENT_AMBULATORY_CARE_PROVIDER_SITE_OTHER): Payer: Medicaid Other | Admitting: Primary Care

## 2022-07-20 ENCOUNTER — Encounter (INDEPENDENT_AMBULATORY_CARE_PROVIDER_SITE_OTHER): Payer: Self-pay | Admitting: Primary Care

## 2022-07-20 ENCOUNTER — Other Ambulatory Visit: Payer: Self-pay

## 2022-07-20 VITALS — BP 140/78 | Ht 64.0 in

## 2022-07-20 DIAGNOSIS — Z1231 Encounter for screening mammogram for malignant neoplasm of breast: Secondary | ICD-10-CM

## 2022-07-20 DIAGNOSIS — M25552 Pain in left hip: Secondary | ICD-10-CM | POA: Diagnosis not present

## 2022-07-20 DIAGNOSIS — M25551 Pain in right hip: Secondary | ICD-10-CM | POA: Diagnosis not present

## 2022-07-20 DIAGNOSIS — Z1211 Encounter for screening for malignant neoplasm of colon: Secondary | ICD-10-CM | POA: Diagnosis not present

## 2022-07-20 DIAGNOSIS — E119 Type 2 diabetes mellitus without complications: Secondary | ICD-10-CM

## 2022-07-20 DIAGNOSIS — I10 Essential (primary) hypertension: Secondary | ICD-10-CM

## 2022-07-20 DIAGNOSIS — G8929 Other chronic pain: Secondary | ICD-10-CM

## 2022-07-20 LAB — POCT GLYCOSYLATED HEMOGLOBIN (HGB A1C): HbA1c, POC (controlled diabetic range): 7.6 % — AB (ref 0.0–7.0)

## 2022-07-20 LAB — GLUCOSE, POCT (MANUAL RESULT ENTRY): POC Glucose: 104 mg/dl — AB (ref 70–99)

## 2022-07-20 MED ORDER — SITAGLIPTIN PHOSPHATE 25 MG PO TABS
25.0000 mg | ORAL_TABLET | Freq: Every day | ORAL | 1 refills | Status: DC
  Filled 2022-07-20: qty 30, 30d supply, fill #0

## 2022-07-20 MED ORDER — METFORMIN HCL ER (MOD) 1000 MG PO TB24
1000.0000 mg | ORAL_TABLET | Freq: Two times a day (BID) | ORAL | 1 refills | Status: DC
  Filled 2022-07-20: qty 60, 30d supply, fill #0
  Filled 2022-07-21: qty 180, 90d supply, fill #0

## 2022-07-20 NOTE — Progress Notes (Signed)
Subjective:  Patient ID: Paula Serrano, female    DOB: 04/28/1961  Age: 61 y.o. MRN: [id number]  CC: Medication Refill and Hip Pain   HPI Anilah Huck presents for follow-up of diabetes. Patient does not check blood sugar at home  Compliant with meds - No Checking CBGs? Yes  Fasting avg - 104  Postprandial average - 120 Exercising regularly? - No Watching carbohydrate intake? - No Neuropathy ? - No Hypoglycemic events - No  - Recovers with :   Pertinent ROS:  Polyuria - No Polydipsia - No Vision problems - No  Medications as noted below. Taking them regularly without complication/adverse reaction being reported today.   History Reia has a past medical history of Arthritis, Asthma, Diabetes mellitus without complication ([place]), and Hypertension.   She has a past surgical history that includes Abdominal hysterectomy.   Her family history is not on file.She reports that she has never smoked. She has never used smokeless tobacco. She reports current alcohol use of about 2.0 standard drinks of alcohol per week. She reports that she does not use drugs.  Current Outpatient Medications on File Prior to Visit  Medication Sig Dispense Refill   albuterol (VENTOLIN HFA) 108 (90 Base) MCG/ACT inhaler Inhale 2 puffs into the lungs every 4 (four) hours as needed. 6.7 g 2   aspirin EC 81 MG tablet Take 81 mg by mouth daily.     Blood Glucose Monitoring Suppl (TRUE METRIX AIR GLUCOSE METER) w/Device KIT 1 Device by Does not apply route in the morning, at noon, and at bedtime. 1 kit 0   calcium-vitamin D (OSCAL WITH D) 500-200 MG-UNIT tablet Take 1 tablet by mouth daily with breakfast. 30 tablet 3   celecoxib (CELEBREX) 100 MG capsule Take 1 capsule (100 mg total) by mouth 2 (two) times daily. 180 capsule 1   glucose blood (TRUE METRIX BLOOD GLUCOSE TEST) test strip Use 3 times daily before meals 100 each 12   lisinopril-hydrochlorothiazide (ZESTORETIC) 20-12.5 MG tablet Take 2 tablets by  mouth daily. 90 tablet 1   metFORMIN (GLUCOPHAGE-XR) 500 MG 24 hr tablet Take 1 tablet (500 mg total) by mouth daily with breakfast. 90 tablet 1   Multiple Vitamins-Minerals (MULTIVITAMIN & MINERAL PO) Take 1 tablet by mouth daily.     TRUEPLUS LANCETS 28G MISC 1 each by Does not apply route 3 (three) times daily before meals. 100 each 12   atorvastatin (LIPITOR) 40 MG tablet Take 1 tablet (40 mg total) by mouth daily. 90 tablet 0   No current facility-administered medications on file prior to visit.    ROS Review of Systems  HENT: Negative.    Respiratory: Negative.    Cardiovascular: Negative.   Gastrointestinal: Negative.   Endocrine: Negative for polyphagia and polyuria.  Genitourinary: Negative.   Musculoskeletal:  Positive for arthralgias, gait problem and joint swelling.  Skin: Negative.   Allergic/Immunologic: Positive for environmental allergies.  Psychiatric/Behavioral: Negative.      Objective:  There were no vitals taken for this visit.  BP Readings from Last 3 Encounters:  01/18/22 134/81  02/26/20 (Abnormal) 148/86  12/26/19 133/85    Wt Readings from Last 3 Encounters:  01/18/22 225 lb 6.4 oz (102.2 kg)  02/26/20 226 lb 14.4 oz (102.9 kg)  12/26/19 227 lb 6.4 oz (103.1 kg)     Physical exam: General: Vital signs reviewed.  Patient is well-developed and well-nourished, Body mass index is 38.69 kg/m. in no acute distress and cooperative with exam. Head:  Normocephalic and atraumatic. Eyes: EOMI, conjunctivae normal, no scleral icterus. Neck: Supple, trachea midline, normal ROM, no JVD, masses, thyromegaly, or carotid bruit present. Cardiovascular: RRR, S1 normal, S2 normal, no murmurs, gallops, or rubs. Pulmonary/Chest: Clear to auscultation bilaterally, no wheezes, rales, or rhonchi. Abdominal: Soft, non-tender, non-distended, BS +, no masses, organomegaly, or guarding present. Musculoskeletal: No joint deformities, erythema, bilateral  stiffness  hips/knees, ROM full and nontender. Extremities: No lower extremity edema bilaterally,  pulses symmetric and intact bilaterally. No cyanosis or clubbing. Neurological: A&O x3, Strength is normal Skin: Warm, dry and intact. No rashes or erythema. Psychiatric: Normal mood and affect. speech and behavior is normal. Cognition and memory are normal.    Lab Results  Component Value Date   HGBA1C 6.4 (A) 01/18/2022   HGBA1C 6.2 (A) 12/26/2019   HGBA1C 6.6 (A) 03/20/2019    Lab Results  Component Value Date   WBC 4.5 01/19/2022   HGB 13.0 01/19/2022   HCT 39.4 01/19/2022   PLT 196 01/19/2022   GLUCOSE 100 (H) 01/19/2022   CHOL 212 (H) 01/19/2022   TRIG 146 01/19/2022   HDL 46 01/19/2022   LDLCALC 140 (H) 01/19/2022   ALT 13 01/19/2022   AST 21 01/19/2022   NA 142 01/19/2022   K 4.0 01/19/2022   CL 101 01/19/2022   CREATININE 0.81 01/19/2022   BUN 15 01/19/2022   CO2 26 01/19/2022   TSH 1.820 03/20/2019   HGBA1C 6.4 (A) 01/18/2022   MICROALBUR 27.6 09/10/2015     Assessment & Plan:   Alliya was seen today for medication refill and hip pain.  Diagnoses and all orders for this visit:  Type 2 diabetes mellitus without complication, without long-term current use of insulin ([place]) - educated on lifestyle modifications, including but not limited to diet choices and adding exercise to daily routine.   -     POCT glucose (manual entry) -     POCT glycosylated hemoglobin (Hb A1C) -     Microalbumin / creatinine urine ratio -     Lipid Panel -     CBC with Differential  Chronic pain of both hips -     Ambulatory referral to Orthopedic Surgery  Colon cancer screening -     Cologuard  Encounter for screening mammogram for malignant neoplasm of breast -     MM DIGITAL SCREENING BILATERAL; Future  Primary hypertension BP goal - < 140/90 Explained that having normal blood pressure is the goal and medications are helping to get to goal and maintain normal blood pressure. DIET:  Limit salt intake, read nutrition labels to check salt content, limit fried and high fatty foods  Avoid using multisymptom OTC cold preparations that generally contain sudafed which can rise BP. Consult with pharmacist on best cold relief products to use for persons with HTN EXERCISE Discussed incorporating exercise such as walking - 30 minutes most days of the week and can do in 10 minute intervals    -     CMP14+EGFR   I am having Janeece Riggers maintain her aspirin EC, Multiple Vitamins-Minerals (MULTIVITAMIN & MINERAL PO), TRUEplus Lancets 28G, calcium-vitamin D, True Metrix Air Glucose Meter, True Metrix Blood Glucose Test, lisinopril-hydrochlorothiazide, metFORMIN, celecoxib, atorvastatin, and albuterol.  No orders of the defined types were placed in this encounter.    Follow-up:   No follow-ups on file.  The above assessment and management plan was discussed with the patient. The patient verbalized understanding of and has agreed to the management plan.  Patient is aware to call the clinic if symptoms fail to improve or worsen. Patient is aware when to return to the clinic for a follow-up visit. Patient educated on when it is appropriate to go to the emergency department.   Juluis Mire, NP-C

## 2022-07-21 ENCOUNTER — Other Ambulatory Visit: Payer: Self-pay

## 2022-07-21 ENCOUNTER — Other Ambulatory Visit: Payer: Self-pay | Admitting: Pharmacist

## 2022-07-21 ENCOUNTER — Telehealth (INDEPENDENT_AMBULATORY_CARE_PROVIDER_SITE_OTHER): Payer: Self-pay | Admitting: Primary Care

## 2022-07-21 MED ORDER — METFORMIN HCL ER 500 MG PO TB24
1000.0000 mg | ORAL_TABLET | Freq: Two times a day (BID) | ORAL | 3 refills | Status: DC
  Filled 2022-07-21: qty 120, 30d supply, fill #0

## 2022-07-21 NOTE — Telephone Encounter (Signed)
Rx sent to pharmacy   

## 2022-07-21 NOTE — Telephone Encounter (Signed)
Paula Serrano with [facility] pharmacy states they do not carry Metformin 1000 mg in stock due to cost. Asking if prescription can be changed to Metformin ER 500 mg 2 pills 2 times daily. Please advise pharmacy.

## 2022-07-22 LAB — CBC WITH DIFFERENTIAL/PLATELET
Basophils Absolute: 0.1 10*3/uL (ref 0.0–0.2)
Basos: 1 %
EOS (ABSOLUTE): 0.2 10*3/uL (ref 0.0–0.4)
Eos: 3 %
Hematocrit: 38.2 % (ref 34.0–46.6)
Hemoglobin: 13.1 g/dL (ref 11.1–15.9)
Immature Grans (Abs): 0.1 10*3/uL (ref 0.0–0.1)
Immature Granulocytes: 1 %
Lymphocytes Absolute: 3.1 10*3/uL (ref 0.7–3.1)
Lymphs: 43 %
MCH: 31.3 pg (ref 26.6–33.0)
MCHC: 34.3 g/dL (ref 31.5–35.7)
MCV: 91 fL (ref 79–97)
Monocytes Absolute: 0.5 10*3/uL (ref 0.1–0.9)
Monocytes: 8 %
Neutrophils Absolute: 3.1 10*3/uL (ref 1.4–7.0)
Neutrophils: 44 %
Platelets: 348 10*3/uL (ref 150–450)
RBC: 4.19 x10E6/uL (ref 3.77–5.28)
RDW: 13.8 % (ref 11.7–15.4)
WBC: 7.1 10*3/uL (ref 3.4–10.8)

## 2022-07-22 LAB — CMP14+EGFR
ALT: 15 IU/L (ref 0–32)
AST: 16 IU/L (ref 0–40)
Albumin/Globulin Ratio: 1.5 (ref 1.2–2.2)
Albumin: 4.7 g/dL (ref 3.9–4.9)
Alkaline Phosphatase: 77 IU/L (ref 44–121)
BUN/Creatinine Ratio: 13 (ref 12–28)
BUN: 12 mg/dL (ref 8–27)
Bilirubin Total: 0.6 mg/dL (ref 0.0–1.2)
CO2: 28 mmol/L (ref 20–29)
Calcium: 9.9 mg/dL (ref 8.7–10.3)
Chloride: 98 mmol/L (ref 96–106)
Creatinine, Ser: 0.89 mg/dL (ref 0.57–1.00)
Globulin, Total: 3.1 g/dL (ref 1.5–4.5)
Glucose: 101 mg/dL — ABNORMAL HIGH (ref 70–99)
Potassium: 3.7 mmol/L (ref 3.5–5.2)
Sodium: 141 mmol/L (ref 134–144)
Total Protein: 7.8 g/dL (ref 6.0–8.5)
eGFR: 73 mL/min/{1.73_m2} (ref >59)

## 2022-07-22 LAB — LIPID PANEL
Chol/HDL Ratio: 4.6 ratio — ABNORMAL HIGH (ref 0.0–4.4)
Cholesterol, Total: 274 mg/dL — ABNORMAL HIGH (ref 100–199)
HDL: 60 mg/dL (ref >39)
LDL Chol Calc (NIH): 185 mg/dL — ABNORMAL HIGH (ref 0–99)
Triglycerides: 157 mg/dL — ABNORMAL HIGH (ref 0–149)
VLDL Cholesterol Cal: 29 mg/dL (ref 5–40)

## 2022-07-22 LAB — MICROALBUMIN / CREATININE URINE RATIO
Creatinine, Urine: 235.1 mg/dL
Microalb/Creat Ratio: 27 mg/g creat (ref 0–29)
Microalbumin, Urine: 62.7 ug/mL

## 2022-07-25 ENCOUNTER — Other Ambulatory Visit (INDEPENDENT_AMBULATORY_CARE_PROVIDER_SITE_OTHER): Payer: Self-pay | Admitting: Primary Care

## 2022-07-25 ENCOUNTER — Other Ambulatory Visit: Payer: Self-pay

## 2022-07-25 DIAGNOSIS — E782 Mixed hyperlipidemia: Secondary | ICD-10-CM

## 2022-07-25 MED ORDER — ATORVASTATIN CALCIUM 80 MG PO TABS
80.0000 mg | ORAL_TABLET | Freq: Every day | ORAL | 1 refills | Status: DC
  Filled 2022-07-25: qty 90, 90d supply, fill #0

## 2022-07-31 ENCOUNTER — Other Ambulatory Visit (INDEPENDENT_AMBULATORY_CARE_PROVIDER_SITE_OTHER): Payer: Self-pay | Admitting: Primary Care

## 2022-07-31 ENCOUNTER — Other Ambulatory Visit: Payer: Self-pay

## 2022-07-31 DIAGNOSIS — I10 Essential (primary) hypertension: Secondary | ICD-10-CM

## 2022-07-31 DIAGNOSIS — Z760 Encounter for issue of repeat prescription: Secondary | ICD-10-CM

## 2022-07-31 MED ORDER — LISINOPRIL-HYDROCHLOROTHIAZIDE 20-12.5 MG PO TABS
2.0000 | ORAL_TABLET | Freq: Every day | ORAL | 1 refills | Status: DC
  Filled 2022-07-31: qty 180, 90d supply, fill #0

## 2022-08-02 ENCOUNTER — Other Ambulatory Visit (INDEPENDENT_AMBULATORY_CARE_PROVIDER_SITE_OTHER): Payer: Self-pay | Admitting: Primary Care

## 2022-08-02 ENCOUNTER — Other Ambulatory Visit: Payer: Self-pay

## 2022-08-02 DIAGNOSIS — E119 Type 2 diabetes mellitus without complications: Secondary | ICD-10-CM

## 2022-08-02 MED ORDER — TRUE METRIX AIR GLUCOSE METER W/DEVICE KIT
1.0000 | PACK | Freq: Three times a day (TID) | 0 refills | Status: DC
  Filled 2022-08-02: qty 1, 30d supply, fill #0

## 2022-08-03 ENCOUNTER — Other Ambulatory Visit (INDEPENDENT_AMBULATORY_CARE_PROVIDER_SITE_OTHER): Payer: Self-pay | Admitting: Primary Care

## 2022-08-03 ENCOUNTER — Other Ambulatory Visit: Payer: Self-pay

## 2022-08-03 MED ORDER — ACCU-CHEK GUIDE VI STRP
ORAL_STRIP | 12 refills | Status: AC
  Filled 2022-08-03: qty 100, 33d supply, fill #0
  Filled 2023-04-09: qty 100, 33d supply, fill #1
  Filled 2023-04-10: qty 100, 33d supply, fill #0

## 2022-08-03 MED ORDER — ACCU-CHEK GUIDE W/DEVICE KIT
1.0000 | PACK | Freq: Three times a day (TID) | 0 refills | Status: AC
  Filled 2022-08-03: qty 1, 30d supply, fill #0

## 2022-08-03 MED ORDER — ACCU-CHEK SOFTCLIX LANCETS MISC
12 refills | Status: DC
  Filled 2022-08-03: qty 100, 33d supply, fill #0
  Filled 2023-04-09: qty 100, 33d supply, fill #1
  Filled 2023-04-10: qty 100, 33d supply, fill #0

## 2022-08-04 ENCOUNTER — Ambulatory Visit (INDEPENDENT_AMBULATORY_CARE_PROVIDER_SITE_OTHER): Payer: Medicaid Other

## 2022-08-04 ENCOUNTER — Ambulatory Visit: Payer: Self-pay

## 2022-08-04 ENCOUNTER — Other Ambulatory Visit: Payer: Self-pay

## 2022-08-04 ENCOUNTER — Ambulatory Visit (INDEPENDENT_AMBULATORY_CARE_PROVIDER_SITE_OTHER): Payer: Medicaid Other | Admitting: Family

## 2022-08-04 DIAGNOSIS — M5442 Lumbago with sciatica, left side: Secondary | ICD-10-CM

## 2022-08-04 DIAGNOSIS — M25561 Pain in right knee: Secondary | ICD-10-CM | POA: Diagnosis not present

## 2022-08-04 DIAGNOSIS — M25562 Pain in left knee: Secondary | ICD-10-CM

## 2022-08-04 DIAGNOSIS — G8929 Other chronic pain: Secondary | ICD-10-CM | POA: Diagnosis not present

## 2022-08-04 DIAGNOSIS — M5441 Lumbago with sciatica, right side: Secondary | ICD-10-CM

## 2022-08-04 MED ORDER — PREDNISONE 50 MG PO TABS
50.0000 mg | ORAL_TABLET | Freq: Every day | ORAL | 0 refills | Status: AC
  Filled 2022-08-04: qty 5, 5d supply, fill #0

## 2022-08-04 NOTE — Progress Notes (Signed)
Office Visit Note   Patient: Paula Serrano           Date of Birth: 12/15/1960           MRN: [id number] Visit Date: 08/04/2022              Requested by: Kerin Perna, NP [address] [place],  [place] [id number] PCP: Kerin Perna, NP  Chief Complaint  Patient presents with   Right Hip - Pain   Left Hip - Pain      HPI: The patient is a 62 year old woman who presents today with multiple issues.  She has had a greater than 10-year history of bilateral hip back and knee pain.  She has not had any recent injuries.  She complains of low back pain which wraps around her hips.  She has burning pain down the both legs especially the lateral aspect of the right thigh.  She also is having some extension of her symptoms into the posterior calf and heel on the right/.  Complains of heaviness of the legs.  She states when she is washing dishes she often has to lean over on the counter for relief of her pain  She also has knee pain with mechanical symptoms pain primarily over the medial border bilaterally.  Intermittent swelling.  This has been ongoing for several years she has had Depo-Medrol injections in the past.  She also has had a Toradol injection a couple years ago which did provide relief for about a month.  Assessment & Plan: Visit Diagnoses:  1. Chronic bilateral low back pain with bilateral sciatica   2. Chronic pain of both knees     Plan: Depo-Medrol injection bilateral knees for osteoarthritis of bilateral knees.  Will place her on a prednisone burst for the lumbar radiculopathy proceed with MRI scan of the lumbar spine.  Referral to Dr. Ernestina Patches for evaluation for epidural steroid injection  Follow-Up Instructions: No follow-ups on file.   Right Knee Exam   Tenderness  The patient is experiencing tenderness in the medial joint line.  Range of Motion  The patient has normal right knee ROM.  Tests  Varus: negative Valgus: negative  Other   Effusion: no effusion present   Left Knee Exam   Tenderness  The patient is experiencing tenderness in the medial joint line.  Range of Motion  The patient has normal left knee ROM.  Tests  Varus: negative Valgus: negative  Other  Effusion: no effusion present   Right Hip Exam   Tenderness  The patient is experiencing no tenderness.    Left Hip Exam   Tenderness  The patient is experiencing no tenderness.    Back Exam   Tenderness  The patient is experiencing tenderness in the lumbar.  Range of Motion  The patient has normal back ROM.  Muscle Strength  The patient has normal back strength.  Tests  Straight leg raise right: positive Straight leg raise left: positive  Other  Gait: normal       Patient is alert, oriented, no adenopathy, well-dressed, normal affect, normal respiratory effort. Painless range of motion hips  Imaging: No results found. No images are attached to the encounter.  Labs: Lab Results  Component Value Date   HGBA1C 7.6 (A) 07/20/2022   HGBA1C 6.4 (A) 01/18/2022   HGBA1C 6.2 (A) 12/26/2019   ESRSEDRATE 60 (H) 12/26/2019     Lab Results  Component Value Date   ALBUMIN 4.7  07/20/2022   ALBUMIN 4.1 01/19/2022   ALBUMIN 4.5 12/26/2019    No results found for: "MG" Lab Results  Component Value Date   VD25OH 29.4 (L) 03/20/2019    No results found for: "PREALBUMIN"    Latest Ref Rng & Units 07/20/2022    4:41 PM 01/19/2022   11:10 AM 12/26/2019   10:53 AM  CBC EXTENDED  WBC 3.4 - 10.8 x10E3/uL 7.1  4.5  6.6   RBC 3.77 - 5.28 x10E6/uL 4.19  4.13  4.37   Hemoglobin 11.1 - 15.9 g/dL 13.1  13.0  13.3   HCT 34.0 - 46.6 % 38.2  39.4  40.8   Platelets 150 - 450 x10E3/uL 348  196  288   NEUT# 1.4 - 7.0 x10E3/uL 3.1  2.1  3.0   Lymph# 0.7 - 3.1 x10E3/uL 3.1  1.8  2.8      There is no height or weight on file to calculate BMI.  Orders:  Orders Placed This Encounter  Procedures   XR Lumbar Spine 2-3 Views   XR  Knee 1-2 Views Left   XR Knee 1-2 Views Right   No orders of the defined types were placed in this encounter.    Procedures: Large Joint Inj: bilateral knee on 08/04/2022 10:06 AM Indications: pain Details: 18 G 1.5 in needle, anteromedial approach Consent was given by the patient.      Clinical Data: No additional findings.  ROS:  All other systems negative, except as noted in the HPI. Review of Systems  Objective: Vital Signs: There were no vitals taken for this visit.  Specialty Comments:  No specialty comments available.  PMFS History: Patient Active Problem List   Diagnosis Date Noted   Hyperlipidemia 09/11/2015   Type 2 diabetes mellitus without complication, without long-term current use of insulin ([place]) 09/10/2015   Hypertension 09/10/2015   Obesity 09/10/2015   Arthralgia 09/10/2015   Past Medical History:  Diagnosis Date   Arthritis    Asthma    Diabetes mellitus without complication ([place])    Hypertension     No family history on file.  Past Surgical History:  Procedure Laterality Date   ABDOMINAL HYSTERECTOMY     Social History   Occupational History   Not on file  Tobacco Use   Smoking status: Never   Smokeless tobacco: Never  Vaping Use   Vaping Use: Never used  Substance and Sexual Activity   Alcohol use: Yes    Alcohol/week: 2.0 standard drinks of alcohol    Types: 1 Glasses of wine, 1 Cans of beer per week    Comment: occasionally   Drug use: No   Sexual activity: Yes    Birth control/protection: Surgical

## 2022-08-08 DIAGNOSIS — Z1211 Encounter for screening for malignant neoplasm of colon: Secondary | ICD-10-CM | POA: Diagnosis not present

## 2022-08-16 LAB — COLOGUARD: COLOGUARD: NEGATIVE

## 2022-10-03 ENCOUNTER — Other Ambulatory Visit: Payer: Self-pay

## 2022-10-03 NOTE — Progress Notes (Signed)
Patient outreached by Georga Kaufmann, PharmD Candidate on 10/03/22 to discuss hypertension    Patient has an automated home blood pressure machine. They report home readings of 120/84 and 113/79 (most recent)    Medication review was performed. They are taking medications as prescribed. Differences from their prescribed list include: N/A   The following barriers to adherence were noted:  - They do not have cost concerns.  - They do not have transportation concerns.  - They do not need assistance obtaining refills.  - They do not occasionally forget to take some of their prescribed medications.  - They do not feel like one/some of their medications make them feel poorly.  - They do not have questions or concerns about their medications.  - They do have follow up scheduled with their primary care provider/cardiologist.   The following interventions were completed:  - Medications were reviewed  - Patient was educated on goal blood pressures and long term health implications of elevated blood pressure  - Patient was educated on proper technique to check home blood pressure and reminded to bring home machine and readings to next provider appointment  - Patient was educated on medications, including indication and administration  - Patient was counseled on lifestyle modifications to improve blood pressure, including DASH diet and exercise (patient's daughter stated it is challenging for her to exercise regularly beyond 10 minutes due to orthopedic issues, but she is aware she needs to lose weight and attempts to go for 30 minute walks outside a few days per week)   The patient has follow up scheduled: 10/17/22  PCP: Juluis Mire, NP   Georga Kaufmann, PharmD Candidate   Maryan Puls, PharmD PGY-1 [facility] Resident

## 2022-10-04 ENCOUNTER — Encounter (INDEPENDENT_AMBULATORY_CARE_PROVIDER_SITE_OTHER): Payer: Self-pay

## 2022-10-17 ENCOUNTER — Other Ambulatory Visit: Payer: Self-pay

## 2022-10-17 ENCOUNTER — Ambulatory Visit (INDEPENDENT_AMBULATORY_CARE_PROVIDER_SITE_OTHER): Payer: Medicaid Other | Admitting: Primary Care

## 2022-10-17 ENCOUNTER — Encounter (INDEPENDENT_AMBULATORY_CARE_PROVIDER_SITE_OTHER): Payer: Self-pay | Admitting: Primary Care

## 2022-10-17 VITALS — BP 144/82 | HR 73 | Ht 64.0 in | Wt 247.8 lb

## 2022-10-17 DIAGNOSIS — E559 Vitamin D deficiency, unspecified: Secondary | ICD-10-CM

## 2022-10-17 DIAGNOSIS — E782 Mixed hyperlipidemia: Secondary | ICD-10-CM | POA: Diagnosis not present

## 2022-10-17 DIAGNOSIS — L600 Ingrowing nail: Secondary | ICD-10-CM

## 2022-10-17 DIAGNOSIS — E119 Type 2 diabetes mellitus without complications: Secondary | ICD-10-CM

## 2022-10-17 DIAGNOSIS — I10 Essential (primary) hypertension: Secondary | ICD-10-CM | POA: Diagnosis not present

## 2022-10-17 DIAGNOSIS — Z760 Encounter for issue of repeat prescription: Secondary | ICD-10-CM

## 2022-10-17 DIAGNOSIS — Z1231 Encounter for screening mammogram for malignant neoplasm of breast: Secondary | ICD-10-CM

## 2022-10-17 LAB — POCT GLYCOSYLATED HEMOGLOBIN (HGB A1C): HbA1c, POC (controlled diabetic range): 7.2 % — AB (ref 0.0–7.0)

## 2022-10-17 MED ORDER — METFORMIN HCL ER 500 MG PO TB24
1000.0000 mg | ORAL_TABLET | Freq: Two times a day (BID) | ORAL | 3 refills | Status: DC
  Filled 2022-10-17: qty 120, 30d supply, fill #0
  Filled 2022-11-21: qty 120, 30d supply, fill #1
  Filled 2022-12-21: qty 120, 30d supply, fill #2

## 2022-10-17 MED ORDER — ATORVASTATIN CALCIUM 80 MG PO TABS
80.0000 mg | ORAL_TABLET | Freq: Every day | ORAL | 1 refills | Status: DC
  Filled 2022-10-17: qty 90, 90d supply, fill #0

## 2022-10-17 MED ORDER — SITAGLIPTIN PHOSPHATE 25 MG PO TABS
25.0000 mg | ORAL_TABLET | Freq: Every day | ORAL | 1 refills | Status: DC
  Filled 2022-10-17: qty 90, 90d supply, fill #0
  Filled 2023-02-12 – 2023-02-13 (×3): qty 90, 90d supply, fill #1

## 2022-10-17 MED ORDER — LISINOPRIL-HYDROCHLOROTHIAZIDE 20-12.5 MG PO TABS
2.0000 | ORAL_TABLET | Freq: Every day | ORAL | 1 refills | Status: DC
  Filled 2022-10-17 – 2022-11-21 (×3): qty 180, 90d supply, fill #0

## 2022-10-17 NOTE — Progress Notes (Signed)
Having pain in both hips and legs.

## 2022-10-17 NOTE — Progress Notes (Signed)
Subjective:  Patient ID: Paula Serrano, female    DOB: Aug 02, 1960  Age: 62 y.o. MRN: [id number]  CC: Diabetes   HPI Carrera Pengelly presents forFollow-up of diabetes. Patient does check blood sugar at home. Management of hypertension Patient has No headache, No chest pain, No abdominal pain - No Nausea, No new weakness tingling or numbness, No Cough - shortness of breath   Compliant with meds - Yes Checking CBGs? Yes  Fasting avg - 120   Postprandial average -  Exercising regularly? - No Watching carbohydrate intake? - No Neuropathy ? - No Hypoglycemic events - No  - Recovers with :   Pertinent ROS:  Polyuria - No Polydipsia - No Vision problems - No  Medications as noted below. Taking them regularly without complication/adverse reaction being reported today.   History Azaiah has a past medical history of Arthritis, Asthma, Diabetes mellitus without complication ([place]), and Hypertension.   She has a past surgical history that includes Abdominal hysterectomy.   Her family history is not on file.She reports that she has never smoked. She has never used smokeless tobacco. She reports current alcohol use of about 2.0 standard drinks of alcohol per week. She reports that she does not use drugs.  Current Outpatient Medications on File Prior to Visit  Medication Sig Dispense Refill   Accu-Chek Softclix Lancets lancets Check blood sugars three times a day before meals 100 each 12   albuterol (VENTOLIN HFA) 108 (90 Base) MCG/ACT inhaler Inhale 2 puffs into the lungs every 4 (four) hours as needed. 6.7 g 2   aspirin EC 81 MG tablet Take 81 mg by mouth daily.     Blood Glucose Monitoring Suppl (ACCU-CHEK GUIDE) w/Device KIT Use as directed. 1 kit 0   calcium-vitamin D (OSCAL WITH D) 500-200 MG-UNIT tablet Take 1 tablet by mouth daily with breakfast. 30 tablet 3   glucose blood (ACCU-CHEK GUIDE) test strip Check blood sugars three times a day before meals 100 each 12   Multiple  Vitamins-Minerals (MULTIVITAMIN & MINERAL PO) Take 1 tablet by mouth daily.     TRUEPLUS LANCETS 28G MISC 1 each by Does not apply route 3 (three) times daily before meals. 100 each 12   predniSONE (DELTASONE) 50 MG tablet Take 1 tablet (50 mg total) by mouth daily for 5 days. (Patient not taking: Reported on 10/17/2022) 5 tablet 0   No current facility-administered medications on file prior to visit.    ROS Comprehensive ROS Pertinent positive and negative noted in HPI    Objective:  Blood Pressure (Abnormal) 144/82   Pulse 73   Height 5\' 4"  (1.626 m)   Weight 247 lb 12.8 oz (112.4 kg)   Oxygen Saturation 97%   Body Mass Index 42.53 kg/m   BP Readings from Last 3 Encounters:  10/17/22 (Abnormal) 144/82  07/20/22 (Abnormal) 140/78  01/18/22 134/81    Wt Readings from Last 3 Encounters:  10/17/22 247 lb 12.8 oz (112.4 kg)  01/18/22 225 lb 6.4 oz (102.2 kg)  02/26/20 226 lb 14.4 oz (102.9 kg)    Physical Exam General: No apparent distress. Morbid obese female Eyes: Extraocular eye movements intact, pupils equal and round. Neck: Supple, trachea midline. Thyroid: No enlargement, mobile without fixation, no tenderness. Cardiovascular: Regular rhythm and rate, no murmur, normal radial pulses. Respiratory: Normal respiratory effort, clear to auscultation. Gastrointestinal: Normal pitch active bowel sounds, nontender abdomen without distention or appreciable hepatomegaly. Musculoskeletal: Normal muscle tone, no tenderness on palpation of tibia, no excessive  thoracic kyphosis. Skin: Appropriate warmth, no visible rash. Mental status: Alert, conversant, speech clear, thought logical, appropriate mood and affect, no hallucinations or delusions evident. Hematologic/lymphatic: No cervical adenopathy, no visible ecchymoses.  Lab Results  Component Value Date   HGBA1C 7.2 (A) 10/17/2022   HGBA1C 7.6 (A) 07/20/2022   HGBA1C 6.4 (A) 01/18/2022    Lab Results  Component Value Date    WBC 7.1 07/20/2022   HGB 13.1 07/20/2022   HCT 38.2 07/20/2022   PLT 348 07/20/2022   GLUCOSE 101 (H) 07/20/2022   CHOL 274 (H) 07/20/2022   TRIG 157 (H) 07/20/2022   HDL 60 07/20/2022   LDLCALC 185 (H) 07/20/2022   ALT 15 07/20/2022   AST 16 07/20/2022   NA 141 07/20/2022   K 3.7 07/20/2022   CL 98 07/20/2022   CREATININE 0.89 07/20/2022   BUN 12 07/20/2022   CO2 28 07/20/2022   TSH 1.820 03/20/2019   HGBA1C 7.2 (A) 10/17/2022   MICROALBUR 27.6 09/10/2015     Assessment & Plan:  Milo was seen today for diabetes.  Diagnoses and all orders for this visit:  Type 2 diabetes mellitus without complication, without long-term current use of insulin (HCC) -     POCT glycosylated hemoglobin (Hb A1C) 7.6 - educated on lifestyle modifications, including but not limited to diet choices and adding exercise to daily routine.   -     metFORMIN (GLUCOPHAGE-XR) 500 MG 24 hr tablet; Take 2 tablets (1,000 mg total) by mouth 2 (two) times daily with a meal. -     sitaGLIPtin (JANUVIA) 25 MG tablet; Take 1 tablet (25 mg total) by mouth daily.  Mixed hyperlipidemia  Healthy lifestyle diet of fruits vegetables fish nuts whole grains and low saturated fat . Foods high in cholesterol or liver, fatty meats,cheese, butter avocados, nuts and seeds, chocolate and fried foods. -     atorvastatin (LIPITOR) 80 MG tablet; Take 1 tablet (80 mg total) by mouth daily.  Medication refill -     lisinopril-hydrochlorothiazide (ZESTORETIC) 20-12.5 MG tablet; Take 2 tablets by mouth daily.  Essential hypertension BP goal - < 140/90 Explained that having normal blood pressure is the goal and medications are helping to get to goal and maintain normal blood pressure. DIET: Limit salt intake, read nutrition labels to check salt content, limit fried and high fatty foods  Avoid using multisymptom OTC cold preparations that generally contain sudafed which can rise BP. Consult with pharmacist on best cold relief  products to use for persons with HTN EXERCISE Discussed incorporating exercise such as walking - 30 minutes most days of the week and can do in 10 minute intervals    -     lisinopril-hydrochlorothiazide (ZESTORETIC) 20-12.5 MG tablet; Take 2 tablets by mouth daily.  Encounter for screening mammogram for malignant neoplasm of breast MAMMOGRAM   Comprehensive diabetic foot examination, type 2 DM, encounter for ([place]) COMPLETED   Ingrown nail of great toe -     Ambulatory referral to Podiatry     I am having Janeece Riggers maintain her aspirin EC, Multiple Vitamins-Minerals (MULTIVITAMIN & MINERAL PO), TRUEplus Lancets 28G, calcium-vitamin D, albuterol, Accu-Chek Guide, Accu-Chek Guide, Accu-Chek Softclix Lancets, predniSONE, atorvastatin, lisinopril-hydrochlorothiazide, metFORMIN, and sitaGLIPtin.  Meds ordered this encounter  Medications   atorvastatin (LIPITOR) 80 MG tablet    Sig: Take 1 tablet (80 mg total) by mouth daily.    Dispense:  90 tablet    Refill:  1    Order Specific  Question:   Supervising Provider    Answer:   Tresa Garter [id number]   lisinopril-hydrochlorothiazide (ZESTORETIC) 20-12.5 MG tablet    Sig: Take 2 tablets by mouth daily.    Dispense:  180 tablet    Refill:  1    Patient Note: Please call office for appointment    Order Specific Question:   Supervising Provider    Answer:   Tresa Garter [id number]   metFORMIN (GLUCOPHAGE-XR) 500 MG 24 hr tablet    Sig: Take 2 tablets (1,000 mg total) by mouth 2 (two) times daily with a meal.    Dispense:  120 tablet    Refill:  3    Order Specific Question:   Supervising Provider    Answer:   Tresa Garter [id number]   sitaGLIPtin (JANUVIA) 25 MG tablet    Sig: Take 1 tablet (25 mg total) by mouth daily.    Dispense:  90 tablet    Refill:  1    Order Specific Question:   Supervising Provider    Answer:   Tresa Garter [id number]     Follow-up:   Return in about 3 months (around  01/17/2023) for dm/htn.  The above assessment and management plan was discussed with the patient. The patient verbalized understanding of and has agreed to the management plan. Patient is aware to call the clinic if symptoms fail to improve or worsen. Patient is aware when to return to the clinic for a follow-up visit. Patient educated on when it is appropriate to go to the emergency department.   Juluis Mire, NP-C

## 2022-10-17 NOTE — Progress Notes (Deleted)
Subjective:  Patient ID: Paula Serrano, female    DOB: 04/17/1961  Age: 61 y.o. MRN: [id number]  CC: Diabetes   HPI Paula Serrano presents forFollow-up of diabetes. Patient does not check blood sugar at home  Compliant with meds - {yes no free text:20080} Checking CBGs? {yes no free text:20080}  Fasting avg -   Postprandial average -  Exercising regularly? - {yes no free text:20080} Watching carbohydrate intake? - {yes no free text:20080} Neuropathy ? - {yes no free text:20080} Hypoglycemic events - {yes no free text:20080}  - Recovers with :   Pertinent ROS:  Polyuria - {yes no free text:20080} Polydipsia - {yes no free text:20080} Vision problems - {yes no free text:20080}  Medications as noted below. Taking them regularly without complication/adverse reaction being reported today.   History Paula Serrano has a past medical history of Arthritis, Asthma, Diabetes mellitus without complication ([place]), and Hypertension.   Paula Serrano has a past surgical history that includes Abdominal hysterectomy.   Her family history is not on file.Paula Serrano reports that Paula Serrano has never smoked. Paula Serrano has never used smokeless tobacco. Paula Serrano reports current alcohol use of about 2.0 standard drinks of alcohol per week. Paula Serrano reports that Paula Serrano does not use drugs.  Current Outpatient Medications on File Prior to Visit  Medication Sig Dispense Refill   Accu-Chek Softclix Lancets lancets Check blood sugars three times a day before meals 100 each 12   albuterol (VENTOLIN HFA) 108 (90 Base) MCG/ACT inhaler Inhale 2 puffs into the lungs every 4 (four) hours as needed. 6.7 g 2   aspirin EC 81 MG tablet Take 81 mg by mouth daily.     atorvastatin (LIPITOR) 80 MG tablet Take 1 tablet (80 mg total) by mouth daily. 90 tablet 1   Blood Glucose Monitoring Suppl (ACCU-CHEK GUIDE) w/Device KIT Use as directed. 1 kit 0   calcium-vitamin D (OSCAL WITH D) 500-200 MG-UNIT tablet Take 1 tablet by mouth daily with breakfast. 30 tablet 3    glucose blood (ACCU-CHEK GUIDE) test strip Check blood sugars three times a day before meals 100 each 12   lisinopril-hydrochlorothiazide (ZESTORETIC) 20-12.5 MG tablet Take 2 tablets by mouth daily. 180 tablet 1   metFORMIN (GLUCOPHAGE-XR) 500 MG 24 hr tablet Take 2 tablets (1,000 mg total) by mouth 2 (two) times daily with a meal. 120 tablet 3   Multiple Vitamins-Minerals (MULTIVITAMIN & MINERAL PO) Take 1 tablet by mouth daily.     sitaGLIPtin (JANUVIA) 25 MG tablet Take 1 tablet (25 mg total) by mouth daily. 90 tablet 1   TRUEPLUS LANCETS 28G MISC 1 each by Does not apply route 3 (three) times daily before meals. 100 each 12   predniSONE (DELTASONE) 50 MG tablet Take 1 tablet (50 mg total) by mouth daily for 5 days. (Patient not taking: Reported on 10/17/2022) 5 tablet 0   No current facility-administered medications on file prior to visit.    ROS Review of Systems  Objective:  Blood Pressure (Abnormal) 144/82   Pulse 73   Height 5\' 4"  (1.626 m)   Weight 247 lb 12.8 oz (112.4 kg)   Oxygen Saturation 97%   Body Mass Index 42.53 kg/m   BP Readings from Last 3 Encounters:  10/17/22 (Abnormal) 144/82  07/20/22 (Abnormal) 140/78  01/18/22 134/81    Wt Readings from Last 3 Encounters:  10/17/22 247 lb 12.8 oz (112.4 kg)  01/18/22 225 lb 6.4 oz (102.2 kg)  02/26/20 226 lb 14.4 oz (102.9 kg)    Physical  Exam  Lab Results  Component Value Date   HGBA1C 7.6 (A) 07/20/2022   HGBA1C 6.4 (A) 01/18/2022   HGBA1C 6.2 (A) 12/26/2019    Lab Results  Component Value Date   WBC 7.1 07/20/2022   HGB 13.1 07/20/2022   HCT 38.2 07/20/2022   PLT 348 07/20/2022   GLUCOSE 101 (H) 07/20/2022   CHOL 274 (H) 07/20/2022   TRIG 157 (H) 07/20/2022   HDL 60 07/20/2022   LDLCALC 185 (H) 07/20/2022   ALT 15 07/20/2022   AST 16 07/20/2022   NA 141 07/20/2022   K 3.7 07/20/2022   CL 98 07/20/2022   CREATININE 0.89 07/20/2022   BUN 12 07/20/2022   CO2 28 07/20/2022   TSH 1.820 03/20/2019    HGBA1C 7.6 (A) 07/20/2022   MICROALBUR 27.6 09/10/2015     Assessment & Plan:   Paula Serrano was seen today for diabetes.  Diagnoses and all orders for this visit:  Type 2 diabetes mellitus without complication, without long-term current use of insulin (HCC) -     POCT glycosylated hemoglobin (Hb A1C)   I am having Paula Serrano maintain her aspirin EC, Multiple Vitamins-Minerals (MULTIVITAMIN & MINERAL PO), TRUEplus Lancets 28G, calcium-vitamin D, albuterol, sitaGLIPtin, metFORMIN, atorvastatin, lisinopril-hydrochlorothiazide, Accu-Chek Guide, Accu-Chek Guide, Accu-Chek Softclix Lancets, and predniSONE.  No orders of the defined types were placed in this encounter.    Follow-up:   No follow-ups on file.  The above assessment and management plan was discussed with the patient. The patient verbalized understanding of and has agreed to the management plan. Patient is aware to call the clinic if symptoms fail to improve or worsen. Patient is aware when to return to the clinic for a follow-up visit. Patient educated on when it is appropriate to go to the emergency department.   Juluis Mire, NP-C

## 2022-10-18 ENCOUNTER — Other Ambulatory Visit: Payer: Self-pay

## 2022-10-23 ENCOUNTER — Other Ambulatory Visit: Payer: Self-pay

## 2022-10-24 ENCOUNTER — Other Ambulatory Visit: Payer: Self-pay

## 2022-10-27 DIAGNOSIS — E119 Type 2 diabetes mellitus without complications: Secondary | ICD-10-CM | POA: Diagnosis not present

## 2022-10-27 DIAGNOSIS — I10 Essential (primary) hypertension: Secondary | ICD-10-CM | POA: Diagnosis not present

## 2022-10-27 DIAGNOSIS — E559 Vitamin D deficiency, unspecified: Secondary | ICD-10-CM | POA: Diagnosis not present

## 2022-10-27 DIAGNOSIS — E782 Mixed hyperlipidemia: Secondary | ICD-10-CM | POA: Diagnosis not present

## 2022-10-28 LAB — CMP14+EGFR
ALT: 17 IU/L (ref 0–32)
AST: 19 IU/L (ref 0–40)
Albumin/Globulin Ratio: 1.7 (ref 1.2–2.2)
Albumin: 4.5 g/dL (ref 3.9–4.9)
Alkaline Phosphatase: 81 IU/L (ref 44–121)
BUN/Creatinine Ratio: 14 (ref 12–28)
BUN: 12 mg/dL (ref 8–27)
Bilirubin Total: 0.8 mg/dL (ref 0.0–1.2)
CO2: 26 mmol/L (ref 20–29)
Calcium: 10.1 mg/dL (ref 8.7–10.3)
Chloride: 101 mmol/L (ref 96–106)
Creatinine, Ser: 0.84 mg/dL (ref 0.57–1.00)
Globulin, Total: 2.7 g/dL (ref 1.5–4.5)
Glucose: 93 mg/dL (ref 70–99)
Potassium: 3.8 mmol/L (ref 3.5–5.2)
Sodium: 143 mmol/L (ref 134–144)
Total Protein: 7.2 g/dL (ref 6.0–8.5)
eGFR: 79 mL/min/{1.73_m2} (ref >59)

## 2022-10-28 LAB — CBC WITH DIFFERENTIAL/PLATELET
Basophils Absolute: 0.1 10*3/uL (ref 0.0–0.2)
Basos: 1 %
EOS (ABSOLUTE): 0.2 10*3/uL (ref 0.0–0.4)
Eos: 3 %
Hematocrit: 38.4 % (ref 34.0–46.6)
Hemoglobin: 12.7 g/dL (ref 11.1–15.9)
Immature Grans (Abs): 0 10*3/uL (ref 0.0–0.1)
Immature Granulocytes: 0 %
Lymphocytes Absolute: 3 10*3/uL (ref 0.7–3.1)
Lymphs: 37 %
MCH: 31.1 pg (ref 26.6–33.0)
MCHC: 33.1 g/dL (ref 31.5–35.7)
MCV: 94 fL (ref 79–97)
Monocytes Absolute: 0.7 10*3/uL (ref 0.1–0.9)
Monocytes: 9 %
Neutrophils Absolute: 4.2 10*3/uL (ref 1.4–7.0)
Neutrophils: 50 %
Platelets: 332 10*3/uL (ref 150–450)
RBC: 4.08 x10E6/uL (ref 3.77–5.28)
RDW: 14.2 % (ref 11.7–15.4)
WBC: 8.2 10*3/uL (ref 3.4–10.8)

## 2022-10-28 LAB — LIPID PANEL
Chol/HDL Ratio: 2.5 ratio (ref 0.0–4.4)
Cholesterol, Total: 127 mg/dL (ref 100–199)
HDL: 50 mg/dL (ref >39)
LDL Chol Calc (NIH): 58 mg/dL (ref 0–99)
Triglycerides: 106 mg/dL (ref 0–149)
VLDL Cholesterol Cal: 19 mg/dL (ref 5–40)

## 2022-10-28 LAB — VITAMIN D 25 HYDROXY (VIT D DEFICIENCY, FRACTURES): Vit D, 25-Hydroxy: 62.5 ng/mL (ref 30.0–100.0)

## 2022-10-30 ENCOUNTER — Other Ambulatory Visit (HOSPITAL_COMMUNITY): Payer: Self-pay

## 2022-10-31 ENCOUNTER — Encounter (HOSPITAL_COMMUNITY): Payer: Self-pay

## 2022-10-31 ENCOUNTER — Other Ambulatory Visit (HOSPITAL_COMMUNITY): Payer: Self-pay

## 2022-11-02 ENCOUNTER — Ambulatory Visit (INDEPENDENT_AMBULATORY_CARE_PROVIDER_SITE_OTHER): Payer: Medicaid Other | Admitting: Podiatry

## 2022-11-02 ENCOUNTER — Encounter: Payer: Self-pay | Admitting: Podiatry

## 2022-11-02 DIAGNOSIS — E119 Type 2 diabetes mellitus without complications: Secondary | ICD-10-CM | POA: Diagnosis not present

## 2022-11-02 DIAGNOSIS — M79674 Pain in right toe(s): Secondary | ICD-10-CM

## 2022-11-02 DIAGNOSIS — L97411 Non-pressure chronic ulcer of right heel and midfoot limited to breakdown of skin: Secondary | ICD-10-CM | POA: Diagnosis not present

## 2022-11-02 DIAGNOSIS — M79675 Pain in left toe(s): Secondary | ICD-10-CM

## 2022-11-02 DIAGNOSIS — B351 Tinea unguium: Secondary | ICD-10-CM

## 2022-11-02 NOTE — Progress Notes (Signed)
  Subjective:  Patient ID: Paula Serrano, female    DOB: 10-03-1960,   MRN: [id number]  Chief Complaint  Patient presents with   Nail Problem    Bilateral nail check     62 y.o. female presents for wart on the right heel and concern of thickened elongated and painful nails that are difficult to trim. Requesting to have them trimmed today. Relates burning and tingling in their feet. Patient is diabetic and last A1c was  Lab Results  Component Value Date   HGBA1C 7.2 (A) 10/17/2022   . Relates the wart has been there for a while and has been treated in the past. She relates she has been scraping it her self and has been painful.   PCP:  Grayce Sessions, NP    . Denies any other pedal complaints. Denies n/v/f/c.   Past Medical History:  Diagnosis Date   Arthritis    Asthma    Diabetes mellitus without complication    Hypertension     Objective:  Physical Exam: Vascular: DP/PT pulses 2/4 bilateral. CFT <3 seconds. Normal hair growth on digits. No edema.  Skin. No lacerations or abrasions bilateral feet. Plantar lesion noted to right heel with thin layer over overlying hyperkeratosis upon debridement very superfiical ucleratin noted about 1 cm x 0.5 cm x 0.1 cm with granular base. Possibly some capillary budding noted to lateral aspect of the lesion Nails 1-5 bilateral are thickened elongated and with subungual debris.  Musculoskeletal: MMT 5/5 bilateral lower extremities in DF, PF, Inversion and Eversion. Deceased ROM in DF of ankle joint. Tender to palpation of the plantar lesion.  Neurological: Sensation intact to light touch. Protective sensation intact.   Assessment:   1. Skin ulcer of right heel, limited to breakdown of skin   2. Type 2 diabetes mellitus without complication, without long-term current use of insulin   3. Pain due to onychomycosis of toenails of both feet      Plan:  Patient was evaluated and treated and all questions answered. -Discussed and educated  patient on diabetic foot care, especially with  regards to the vascular, neurological and musculoskeletal systems.  -Stressed the importance of good glycemic control and the detriment of not  controlling glucose levels in relation to the foot. -Discussed supportive shoes at all times and checking feet regularly.  -Mechanically debrided all nails 1-5 bilateral using sterile nail nipper and filed with dremel without incident  -Ulcer limited to breakdown of skin on right heel  -Debridement as below. -Dressed with betadine, DSD. -Off-loading with surgical shoe. Dispensed  -No abx indicated.  -Discussed glucose control and proper protein-rich diet.  -Discussed if any worsening redness, pain, fever or chills to call or may need to report to the emergency room. Patient expressed understanding.   Procedure: Excisional Debridement of Wound Rationale: Removal of non-viable soft tissue from the wound to promote healing.  Anesthesia: none Pre-Debridement Wound Measurements: Overlying hyperkeratosis  Post-Debridement Wound Measurements: 1 cm x 0.5 cm x 0.1 cm  Type of Debridement: Sharp Excisional Tissue Removed: Non-viable soft tissue Depth of Debridement: subcutaneous tissue. Technique: Sharp excisional debridement to bleeding, viable wound base.  Dressing: Dry, sterile, compression dressing. Disposition: Patient tolerated procedure well. Patient to return in 2 week for follow-up.  Return in about 2 weeks (around 11/16/2022) for wound check.    Louann Sjogren, DPM

## 2022-11-03 ENCOUNTER — Other Ambulatory Visit: Payer: Self-pay

## 2022-11-17 ENCOUNTER — Ambulatory Visit (INDEPENDENT_AMBULATORY_CARE_PROVIDER_SITE_OTHER): Payer: Medicaid Other | Admitting: Podiatry

## 2022-11-17 ENCOUNTER — Encounter: Payer: Self-pay | Admitting: Podiatry

## 2022-11-17 DIAGNOSIS — L97411 Non-pressure chronic ulcer of right heel and midfoot limited to breakdown of skin: Secondary | ICD-10-CM

## 2022-11-17 DIAGNOSIS — L600 Ingrowing nail: Secondary | ICD-10-CM

## 2022-11-17 DIAGNOSIS — E119 Type 2 diabetes mellitus without complications: Secondary | ICD-10-CM

## 2022-11-17 NOTE — Progress Notes (Signed)
  Subjective:  Patient ID: Paula Serrano, female    DOB: 08/12/1960,   MRN: [id number]  Chief Complaint  Patient presents with   Foot Ulcer    2 week follow up skin ulcer of right heel    62 y.o. female presents for follow-up of right heel ulcer. Relate she has been dressing as intructed. Patient is diabetic and last A1c was  Lab Results  Component Value Date   HGBA1C 7.2 (A) 10/17/2022    PCP:  Grayce Sessions, NP    . Denies any other pedal complaints. Denies n/v/f/c.   Past Medical History:  Diagnosis Date   Arthritis    Asthma    Diabetes mellitus without complication (HCC)    Hypertension     Objective:  Physical Exam: Vascular: DP/PT pulses 2/4 bilateral. CFT <3 seconds. Normal hair growth on digits. No edema.  Skin. No lacerations or abrasions bilateral feet. Plantar lesion noted to right heel with thin layer over overlying hyperkeratosis Ulceration healed.  Nails 1-5 bilateral are thickened elongated and with subungual debris. Incurvation of right ingrown nail medial border.  Musculoskeletal: MMT 5/5 bilateral lower extremities in DF, PF, Inversion and Eversion. Deceased ROM in DF of ankle joint. Tender to palpation of the plantar lesion.  Neurological: Sensation intact to light touch. Protective sensation intact.   Assessment:   1. Skin ulcer of right heel, limited to breakdown of skin (HCC)   2. Type 2 diabetes mellitus without complication, without long-term current use of insulin (HCC)   3. Ingrown right greater toenail       Plan:  Patient was evaluated and treated and all questions answered. -Discussed and educated patient on diabetic foot care, especially with  regards to the vascular, neurological and musculoskeletal systems.  -Stressed the importance of good glycemic control and the detriment of not  controlling glucose levels in relation to the foot. -Discussed supportive shoes at all times and checking feet regularly.   -Ulcer right heel  -healed.  No debridement necessary  -May discontinue surgical shoe.  -No abx indicated.  -Discussed glucose control and proper protein-rich diet.  -Discussed if any worsening redness, pain, fever or chills to call or may need to report to the emergency room. Patient expressed understanding.  -Right hallux nail ingrown and tender debrided back in slant back fashion as courtesy today. Discussed if continued pain could consider ingrown nail procedure in the future.  Return in 3 months for rfc.   Return in about 3 months (around 02/16/2023) for rfc.    Louann Sjogren, DPM

## 2022-11-22 ENCOUNTER — Other Ambulatory Visit (HOSPITAL_BASED_OUTPATIENT_CLINIC_OR_DEPARTMENT_OTHER): Payer: Self-pay

## 2022-11-29 ENCOUNTER — Ambulatory Visit (INDEPENDENT_AMBULATORY_CARE_PROVIDER_SITE_OTHER): Payer: Medicaid Other | Admitting: Family

## 2022-11-29 ENCOUNTER — Encounter: Payer: Self-pay | Admitting: Family

## 2022-11-29 ENCOUNTER — Telehealth: Payer: Self-pay

## 2022-11-29 DIAGNOSIS — M1712 Unilateral primary osteoarthritis, left knee: Secondary | ICD-10-CM | POA: Diagnosis not present

## 2022-11-29 DIAGNOSIS — M1711 Unilateral primary osteoarthritis, right knee: Secondary | ICD-10-CM

## 2022-11-29 NOTE — Progress Notes (Signed)
Office Visit Note   Patient: Paula Serrano           Date of Birth: 05/13/1961           MRN: [id number] Visit Date: 11/29/2022              Requested by: Grayce Sessions, NP [address] [place],  [place] [address] PCP: Grayce Sessions, NP  Chief Complaint  Patient presents with   Lower Back - Pain, Follow-up      HPI: The patient is a 62 year old woman who presents today complaining of bilateral knee pain.  They are both equally painful.  She last had Depo-Medrol injections which did not provide adequate relief of her pain.  She is interested in considering supplemental injections for her osteoarthritis before proceeding with total knee arthroplasty.  She knows that she needs to get her weight down some prior to total knee arthroplasty.  She states she has currently lost about 15 pounds.  Assessment & Plan: Visit Diagnoses: No diagnosis found.  Plan: Will request authorization for supplemental injection bilateral knees.  We will also go ahead and resend the referral for her lumbar spine MRI which she has not yet been set up with.  Follow-Up Instructions: No follow-ups on file.   Right Knee Exam   Muscle Strength  The patient has normal right knee strength.  Tenderness  The patient is experiencing tenderness in the medial joint line.  Range of Motion  The patient has normal right knee ROM.  Other  Erythema: absent Effusion: no effusion present   Left Knee Exam   Muscle Strength  The patient has normal left knee strength.  Tenderness  The patient is experiencing tenderness in the medial joint line.  Range of Motion  The patient has normal left knee ROM.  Other  Erythema: absent Effusion: no effusion present      Patient is alert, oriented, no adenopathy, well-dressed, normal affect, normal respiratory effort.   Imaging: No results found. No images are attached to the encounter.  Labs: Lab Results  Component Value Date   HGBA1C 7.2  (A) 10/17/2022   HGBA1C 7.6 (A) 07/20/2022   HGBA1C 6.4 (A) 01/18/2022   ESRSEDRATE 60 (H) 12/26/2019     Lab Results  Component Value Date   ALBUMIN 4.5 10/27/2022   ALBUMIN 4.7 07/20/2022   ALBUMIN 4.1 01/19/2022    No results found for: "MG" Lab Results  Component Value Date   VD25OH 62.5 10/27/2022   VD25OH 29.4 (L) 03/20/2019    No results found for: "PREALBUMIN"    Latest Ref Rng & Units 10/27/2022    1:42 PM 07/20/2022    4:41 PM 01/19/2022   11:10 AM  CBC EXTENDED  WBC 3.4 - 10.8 x10E3/uL 8.2  7.1  4.5   RBC 3.77 - 5.28 x10E6/uL 4.08  4.19  4.13   Hemoglobin 11.1 - 15.9 g/dL 84.6  96.2  95.2   HCT 34.0 - 46.6 % 38.4  38.2  39.4   Platelets 150 - 450 x10E3/uL 332  348  196   NEUT# 1.4 - 7.0 x10E3/uL 4.2  3.1  2.1   Lymph# 0.7 - 3.1 x10E3/uL 3.0  3.1  1.8      There is no height or weight on file to calculate BMI.  Orders:  No orders of the defined types were placed in this encounter.  No orders of the defined types were placed in this encounter.    Procedures: No procedures  performed  Clinical Data: No additional findings.  ROS:  All other systems negative, except as noted in the HPI. Review of Systems  Objective: Vital Signs: There were no vitals taken for this visit.  Specialty Comments:  No specialty comments available.  PMFS History: Patient Active Problem List   Diagnosis Date Noted   Hyperlipidemia 09/11/2015   Type 2 diabetes mellitus without complication, without long-term current use of insulin (HCC) 09/10/2015   Hypertension 09/10/2015   Obesity 09/10/2015   Arthralgia 09/10/2015   Past Medical History:  Diagnosis Date   Arthritis    Asthma    Diabetes mellitus without complication (HCC)    Hypertension     History reviewed. No pertinent family history.  Past Surgical History:  Procedure Laterality Date   ABDOMINAL HYSTERECTOMY     Social History   Occupational History   Not on file  Tobacco Use   Smoking status:  Never   Smokeless tobacco: Never  Vaping Use   Vaping Use: Never used  Substance and Sexual Activity   Alcohol use: Yes    Alcohol/week: 2.0 standard drinks of alcohol    Types: 1 Glasses of wine, 1 Cans of beer per week    Comment: occasionally   Drug use: No   Sexual activity: Yes    Birth control/protection: Surgical

## 2022-11-29 NOTE — Telephone Encounter (Signed)
Talked with patient and advised her that Medicaid does not cover any gel injections.  I did advise patient about TriVisc.  Patient voiced that she understands.

## 2022-11-29 NOTE — Telephone Encounter (Signed)
-----   Message from Adonis Huguenin, NP sent at 11/29/2022  3:40 PM EDT ----- Supplemental injection bilateral knees

## 2023-01-17 ENCOUNTER — Ambulatory Visit (INDEPENDENT_AMBULATORY_CARE_PROVIDER_SITE_OTHER): Payer: No Typology Code available for payment source | Admitting: Primary Care

## 2023-01-29 ENCOUNTER — Encounter (INDEPENDENT_AMBULATORY_CARE_PROVIDER_SITE_OTHER): Payer: Self-pay

## 2023-01-29 ENCOUNTER — Telehealth (INDEPENDENT_AMBULATORY_CARE_PROVIDER_SITE_OTHER): Payer: Self-pay

## 2023-01-29 NOTE — Telephone Encounter (Signed)
Chart review completed for patient. Patient is due for screening mammogram. Mychart message sent to patient to inquire about scheduling mammogram.  Karla Vines, Population Health Specialist.  

## 2023-02-12 ENCOUNTER — Other Ambulatory Visit: Payer: Self-pay

## 2023-02-12 ENCOUNTER — Other Ambulatory Visit (HOSPITAL_BASED_OUTPATIENT_CLINIC_OR_DEPARTMENT_OTHER): Payer: Self-pay

## 2023-02-12 ENCOUNTER — Ambulatory Visit (INDEPENDENT_AMBULATORY_CARE_PROVIDER_SITE_OTHER): Payer: Medicaid Other

## 2023-02-13 ENCOUNTER — Other Ambulatory Visit (HOSPITAL_BASED_OUTPATIENT_CLINIC_OR_DEPARTMENT_OTHER): Payer: Self-pay

## 2023-02-16 ENCOUNTER — Ambulatory Visit: Payer: Medicaid Other | Admitting: Podiatry

## 2023-02-16 DIAGNOSIS — Z91199 Patient's noncompliance with other medical treatment and regimen due to unspecified reason: Secondary | ICD-10-CM

## 2023-02-16 NOTE — Progress Notes (Signed)
No show

## 2023-03-06 ENCOUNTER — Other Ambulatory Visit: Payer: Self-pay | Admitting: Primary Care

## 2023-03-06 DIAGNOSIS — Z1231 Encounter for screening mammogram for malignant neoplasm of breast: Secondary | ICD-10-CM

## 2023-03-07 ENCOUNTER — Ambulatory Visit: Payer: Medicaid Other

## 2023-03-07 DIAGNOSIS — Z1231 Encounter for screening mammogram for malignant neoplasm of breast: Secondary | ICD-10-CM

## 2023-03-28 ENCOUNTER — Ambulatory Visit (INDEPENDENT_AMBULATORY_CARE_PROVIDER_SITE_OTHER): Payer: Medicaid Other | Admitting: Primary Care

## 2023-03-28 VITALS — BP 147/84 | HR 83 | Resp 16 | Wt 227.4 lb

## 2023-03-28 DIAGNOSIS — E6609 Other obesity due to excess calories: Secondary | ICD-10-CM | POA: Diagnosis not present

## 2023-03-28 DIAGNOSIS — I10 Essential (primary) hypertension: Secondary | ICD-10-CM | POA: Diagnosis not present

## 2023-03-28 DIAGNOSIS — E782 Mixed hyperlipidemia: Secondary | ICD-10-CM | POA: Diagnosis not present

## 2023-03-28 DIAGNOSIS — E119 Type 2 diabetes mellitus without complications: Secondary | ICD-10-CM | POA: Diagnosis not present

## 2023-03-28 DIAGNOSIS — Z7984 Long term (current) use of oral hypoglycemic drugs: Secondary | ICD-10-CM | POA: Diagnosis not present

## 2023-03-28 DIAGNOSIS — Z23 Encounter for immunization: Secondary | ICD-10-CM

## 2023-03-28 DIAGNOSIS — Z6838 Body mass index (BMI) 38.0-38.9, adult: Secondary | ICD-10-CM | POA: Diagnosis not present

## 2023-03-28 DIAGNOSIS — E559 Vitamin D deficiency, unspecified: Secondary | ICD-10-CM | POA: Diagnosis not present

## 2023-03-28 LAB — POCT GLYCOSYLATED HEMOGLOBIN (HGB A1C): HbA1c, POC (controlled diabetic range): 6.2 % (ref 0.0–7.0)

## 2023-03-28 NOTE — Patient Instructions (Signed)

## 2023-03-29 LAB — CBC WITH DIFFERENTIAL/PLATELET
Basophils Absolute: 0.1 10*3/uL (ref 0.0–0.2)
Basos: 1 %
EOS (ABSOLUTE): 0.2 10*3/uL (ref 0.0–0.4)
Eos: 3 %
Hematocrit: 40.2 % (ref 34.0–46.6)
Hemoglobin: 12.8 g/dL (ref 11.1–15.9)
Immature Grans (Abs): 0 10*3/uL (ref 0.0–0.1)
Immature Granulocytes: 0 %
Lymphocytes Absolute: 2.6 10*3/uL (ref 0.7–3.1)
Lymphs: 36 %
MCH: 30 pg (ref 26.6–33.0)
MCHC: 31.8 g/dL (ref 31.5–35.7)
MCV: 94 fL (ref 79–97)
Monocytes Absolute: 0.4 10*3/uL (ref 0.1–0.9)
Monocytes: 6 %
Neutrophils Absolute: 4 10*3/uL (ref 1.4–7.0)
Neutrophils: 54 %
Platelets: 347 10*3/uL (ref 150–450)
RBC: 4.27 x10E6/uL (ref 3.77–5.28)
RDW: 14.6 % (ref 11.7–15.4)
WBC: 7.2 10*3/uL (ref 3.4–10.8)

## 2023-03-29 LAB — LIPID PANEL
Chol/HDL Ratio: 2.8 ratio (ref 0.0–4.4)
Cholesterol, Total: 148 mg/dL (ref 100–199)
HDL: 52 mg/dL (ref >39)
LDL Chol Calc (NIH): 76 mg/dL (ref 0–99)
Triglycerides: 114 mg/dL (ref 0–149)
VLDL Cholesterol Cal: 20 mg/dL (ref 5–40)

## 2023-03-29 LAB — CMP14+EGFR
ALT: 13 IU/L (ref 0–32)
AST: 16 IU/L (ref 0–40)
Albumin: 4.5 g/dL (ref 3.9–4.9)
Alkaline Phosphatase: 70 IU/L (ref 44–121)
BUN/Creatinine Ratio: 16 (ref 12–28)
BUN: 13 mg/dL (ref 8–27)
Bilirubin Total: 0.7 mg/dL (ref 0.0–1.2)
CO2: 25 mmol/L (ref 20–29)
Calcium: 9.8 mg/dL (ref 8.7–10.3)
Chloride: 100 mmol/L (ref 96–106)
Creatinine, Ser: 0.83 mg/dL (ref 0.57–1.00)
Globulin, Total: 2.7 g/dL (ref 1.5–4.5)
Glucose: 87 mg/dL (ref 70–99)
Potassium: 4.4 mmol/L (ref 3.5–5.2)
Sodium: 141 mmol/L (ref 134–144)
Total Protein: 7.2 g/dL (ref 6.0–8.5)
eGFR: 80 mL/min/{1.73_m2} (ref >59)

## 2023-03-29 LAB — VITAMIN D 25 HYDROXY (VIT D DEFICIENCY, FRACTURES): Vit D, 25-Hydroxy: 37.6 ng/mL (ref 30.0–100.0)

## 2023-03-30 ENCOUNTER — Other Ambulatory Visit (HOSPITAL_COMMUNITY): Payer: Self-pay

## 2023-03-30 ENCOUNTER — Other Ambulatory Visit (INDEPENDENT_AMBULATORY_CARE_PROVIDER_SITE_OTHER): Payer: Self-pay | Admitting: Primary Care

## 2023-03-30 DIAGNOSIS — I10 Essential (primary) hypertension: Secondary | ICD-10-CM

## 2023-03-30 DIAGNOSIS — Z760 Encounter for issue of repeat prescription: Secondary | ICD-10-CM

## 2023-03-30 DIAGNOSIS — E119 Type 2 diabetes mellitus without complications: Secondary | ICD-10-CM

## 2023-03-30 MED ORDER — ATORVASTATIN CALCIUM 20 MG PO TABS
20.0000 mg | ORAL_TABLET | Freq: Every day | ORAL | 3 refills | Status: AC
  Filled 2023-03-30 – 2023-04-02 (×2): qty 90, 90d supply, fill #0
  Filled 2023-07-10: qty 90, 90d supply, fill #1
  Filled 2023-10-29 – 2023-11-22 (×2): qty 90, 90d supply, fill #2

## 2023-03-30 MED ORDER — LISINOPRIL-HYDROCHLOROTHIAZIDE 20-25 MG PO TABS
1.0000 | ORAL_TABLET | Freq: Every day | ORAL | 3 refills | Status: AC
Start: 2023-03-30 — End: ?
  Filled 2023-03-30 – 2023-04-02 (×2): qty 90, 90d supply, fill #0
  Filled 2023-07-10: qty 90, 90d supply, fill #1
  Filled 2023-10-29 – 2023-11-22 (×2): qty 90, 90d supply, fill #2

## 2023-03-30 MED ORDER — METFORMIN HCL ER 500 MG PO TB24
1000.0000 mg | ORAL_TABLET | Freq: Two times a day (BID) | ORAL | 3 refills | Status: DC
Start: 2023-03-30 — End: 2023-03-30
  Filled 2023-03-30: qty 120, 30d supply, fill #0

## 2023-03-30 MED ORDER — METFORMIN HCL ER 500 MG PO TB24
1000.0000 mg | ORAL_TABLET | Freq: Two times a day (BID) | ORAL | 3 refills | Status: DC
  Filled 2023-03-30: qty 120, 30d supply, fill #0
  Filled 2023-04-02: qty 360, 90d supply, fill #0
  Filled 2023-07-10: qty 120, 30d supply, fill #1

## 2023-03-31 ENCOUNTER — Other Ambulatory Visit: Payer: Self-pay

## 2023-04-02 ENCOUNTER — Other Ambulatory Visit: Payer: Self-pay

## 2023-04-02 ENCOUNTER — Other Ambulatory Visit (INDEPENDENT_AMBULATORY_CARE_PROVIDER_SITE_OTHER): Payer: Self-pay | Admitting: Primary Care

## 2023-04-02 ENCOUNTER — Other Ambulatory Visit (HOSPITAL_BASED_OUTPATIENT_CLINIC_OR_DEPARTMENT_OTHER): Payer: Self-pay

## 2023-04-02 DIAGNOSIS — E782 Mixed hyperlipidemia: Secondary | ICD-10-CM

## 2023-04-02 DIAGNOSIS — E119 Type 2 diabetes mellitus without complications: Secondary | ICD-10-CM

## 2023-04-02 MED ORDER — SITAGLIPTIN PHOSPHATE 25 MG PO TABS
25.0000 mg | ORAL_TABLET | Freq: Every day | ORAL | 1 refills | Status: AC
Start: 2023-04-02 — End: ?
  Filled 2023-04-02 – 2023-10-29 (×2): qty 90, 90d supply, fill #0

## 2023-04-02 NOTE — Progress Notes (Signed)
[facility]  Paula Serrano, is a 62 y.o. female  YQM:[id number]  BMW:[id number]  DOB - 01/21/1961  Chief Complaint  Patient presents with   Diabetes   Hypertension       Subjective:   Paula Serrano is a 62 y.o. female here today for a follow up visit. For HTN. Patient has No headache, No chest pain, No abdominal pain - No Nausea, No new weakness tingling or numbness, No Cough - shortness of breath. Also, management of T2D-Denies polyuria, polydipsia, polyphasia or vision changes.  Does not check blood sugars at home. She is doing a lot better her knees are feeling better and able to walk around for excising. She mention she may go home to [place] she hopes some time soon.  No problems updated.  No Known Allergies  Past Medical History:  Diagnosis Date   Arthritis    Asthma    Diabetes mellitus without complication (HCC)    Hypertension     Current Outpatient Medications on File Prior to Visit  Medication Sig Dispense Refill   albuterol (VENTOLIN HFA) 108 (90 Base) MCG/ACT inhaler Inhale 2 puffs into the lungs every 4 (four) hours as needed. 6.7 g 2   aspirin EC 81 MG tablet Take 81 mg by mouth daily.     calcium-vitamin D (OSCAL WITH D) 500-200 MG-UNIT tablet Take 1 tablet by mouth daily with breakfast. 30 tablet 3   Multiple Vitamins-Minerals (MULTIVITAMIN & MINERAL PO) Take 1 tablet by mouth daily.     Accu-Chek Softclix Lancets lancets Check blood sugars three times a day before meals 100 each 12   Blood Glucose Monitoring Suppl (ACCU-CHEK GUIDE) w/Device KIT Use as directed. 1 kit 0   glucose blood (ACCU-CHEK GUIDE) test strip Check blood sugars three times a day before meals 100 each 12   predniSONE (DELTASONE) 50 MG tablet Take 1 tablet (50 mg total) by mouth daily for 5 days. (Patient not taking: Reported on 10/17/2022) 5 tablet 0   TRUEPLUS LANCETS 28G MISC 1 each by Does not apply route 3 (three) times daily before meals. 100 each 12   No current  facility-administered medications on file prior to visit.    Objective:   Vitals:   03/28/23 0909 03/28/23 0912  BP: (!) 147/83 (!) 147/84  Pulse: 83 83  Resp: 16   SpO2: 96%   Weight: 227 lb 6.4 oz (103.1 kg)     Comprehensive ROS Pertinent positive and negative noted in HPI   Exam General appearance : Awake, alert, not in any distress. Speech Clear. Not toxic looking HEENT: Atraumatic and Normocephalic, pupils equally reactive to light and accomodation Neck: Supple, no JVD. No cervical lymphadenopathy.  Chest: Good air entry bilaterally, no added sounds  CVS: S1 S2 regular, no murmurs.  Abdomen: Bowel sounds present, Non tender and not distended with no gaurding, rigidity or rebound. Extremities: B/L Lower Ext shows no edema, both legs are warm to touch Neurology: Awake alert, and oriented X 3, CN II-XII intact, Non focal Skin: No Rash  Data Review Lab Results  Component Value Date   HGBA1C 6.2 03/28/2023   HGBA1C 7.2 (A) 10/17/2022   HGBA1C 7.6 (A) 07/20/2022    Assessment & Plan   Paula Serrano was seen today for diabetes and hypertension.  Diagnoses and all orders for this visit:  Type 2 diabetes mellitus without complication, without long-term current use of insulin (HCC) -     POCT glycosylated hemoglobin (Hb A1C) 6.2 previously 6 months  ago 7.2  - educated on lifestyle modifications, including but not limited to diet choices and adding exercise to daily routine.   -     Ambulatory referral to Ophthalmology -     CBC with Differential  Encounter for immunization -     Flu vaccine trivalent PF, 6mos and older(Flulaval,Afluria,Fluarix,Fluzone)  Mixed hyperlipidemia  Healthy lifestyle diet of fruits vegetables fish nuts whole grains and low saturated fat . Foods high in cholesterol or liver, fatty meats,cheese, butter avocados, nuts and seeds, chocolate and fried foods. -     Lipid Panel  Class 2 obesity due to excess calories without serious comorbidity with body  mass index (BMI) of 38.0 to 38.9 in adult Obesity is 30-39 indicating an excess in caloric intake or underlining conditions. This may lead to other co-morbidities. Educated on lifestyle modifications of diet and exercise which may reduce obesity.  She has lost 20lbs since her last visit   Essential hypertension BP goal - < 130/80 Explained that having normal blood pressure is the goal and medications are helping to get to goal and maintain normal blood pressure. DIET: Limit salt intake, read nutrition labels to check salt content, limit fried and high fatty foods  Avoid using multisymptom OTC cold preparations that generally contain sudafed which can rise BP. Consult with pharmacist on best cold relief products to use for persons with HTN EXERCISE Discussed incorporating exercise such as walking - 30 minutes most days of the week and can do in 10 minute intervals    -     CMP14+EGFR  Vitamin D deficiency -     VITAMIN D 25 Hydroxy (Vit-D Deficiency, Fractures)     Patient have been counseled extensively about nutrition and exercise. Other issues discussed during this visit include: low cholesterol diet, weight control and daily exercise, foot care, annual eye examinations at Ophthalmology, importance of adherence with medications and regular follow-up. We also discussed long term complications of uncontrolled diabetes and hypertension.   No follow-ups on file.  The patient was given clear instructions to go to ER or return to medical center if symptoms don't improve, worsen or new problems develop. The patient verbalized understanding. The patient was told to call to get lab results if they haven't heard anything in the next week.   This note has been created with Education officer, environmental. Any transcriptional errors are unintentional.   Grayce Sessions, NP 04/02/2023, 6:59 PM

## 2023-04-03 ENCOUNTER — Other Ambulatory Visit: Payer: Self-pay

## 2023-04-03 ENCOUNTER — Other Ambulatory Visit (HOSPITAL_BASED_OUTPATIENT_CLINIC_OR_DEPARTMENT_OTHER): Payer: Self-pay

## 2023-04-06 ENCOUNTER — Other Ambulatory Visit: Payer: Self-pay

## 2023-04-06 NOTE — Progress Notes (Cosign Needed Addendum)
Barry Agosta 21-Aug-1960 [id number]  Patient outreached by Thomasene Ripple , PharmD Candidate on 04/06/23. Called back patient and successfully answered.   Blood Pressure Readings: Last documented ambulatory systolic blood pressure: 147 Last documented ambulatory diastolic blood pressure: 84 Does the patient have a validated home blood pressure machine?: YesThey report home readings around the 120's.  Medication review was performed. Is the patient taking their medications as prescribed?: Yes  The following barriers to adherence were noted: Does the patient have cost concerns?: No Does the patient have transportation concerns?: No Does the patient need assistance obtaining refills?: No Does the patient occassionally forget to take some of their prescribed medications?: Yes (around 3 doses per month) Does the patient feel like one/some of their medications make them feel poorly?: No Does the patient have questions or concerns about their medications?: No Does the patient have a follow up scheduled with their primary care provider/cardiologist?: Yes   Interventions: Interventions Completed: Medications were reviewed  The patient has follow up scheduled:  PCP: Grayce Sessions, NP   Thomasene Ripple, Student-PharmD

## 2023-04-09 ENCOUNTER — Other Ambulatory Visit: Payer: Self-pay

## 2023-04-09 ENCOUNTER — Other Ambulatory Visit (INDEPENDENT_AMBULATORY_CARE_PROVIDER_SITE_OTHER): Payer: Self-pay | Admitting: Primary Care

## 2023-04-09 NOTE — Telephone Encounter (Signed)
Will forward to provider  

## 2023-04-10 ENCOUNTER — Other Ambulatory Visit: Payer: Self-pay

## 2023-04-10 ENCOUNTER — Other Ambulatory Visit (HOSPITAL_BASED_OUTPATIENT_CLINIC_OR_DEPARTMENT_OTHER): Payer: Self-pay

## 2023-04-10 ENCOUNTER — Other Ambulatory Visit (INDEPENDENT_AMBULATORY_CARE_PROVIDER_SITE_OTHER): Payer: Self-pay

## 2023-04-10 MED ORDER — ALBUTEROL SULFATE HFA 108 (90 BASE) MCG/ACT IN AERS
2.0000 | INHALATION_SPRAY | RESPIRATORY_TRACT | 2 refills | Status: DC | PRN
  Filled 2023-04-10: qty 6.7, 17d supply, fill #0

## 2023-04-10 MED ORDER — ALBUTEROL SULFATE HFA 108 (90 BASE) MCG/ACT IN AERS
2.0000 | INHALATION_SPRAY | RESPIRATORY_TRACT | 2 refills | Status: AC | PRN
  Filled 2023-04-10: qty 18, 16d supply, fill #0
  Filled 2023-04-10: qty 18, 25d supply, fill #0

## 2023-04-11 ENCOUNTER — Other Ambulatory Visit (HOSPITAL_BASED_OUTPATIENT_CLINIC_OR_DEPARTMENT_OTHER): Payer: Self-pay

## 2023-04-12 ENCOUNTER — Telehealth (INDEPENDENT_AMBULATORY_CARE_PROVIDER_SITE_OTHER): Payer: Self-pay

## 2023-04-12 NOTE — Telephone Encounter (Signed)
MyChart message

## 2023-07-10 ENCOUNTER — Other Ambulatory Visit (HOSPITAL_BASED_OUTPATIENT_CLINIC_OR_DEPARTMENT_OTHER): Payer: Self-pay

## 2023-10-29 ENCOUNTER — Other Ambulatory Visit (INDEPENDENT_AMBULATORY_CARE_PROVIDER_SITE_OTHER): Payer: Self-pay | Admitting: Primary Care

## 2023-10-29 ENCOUNTER — Other Ambulatory Visit (HOSPITAL_BASED_OUTPATIENT_CLINIC_OR_DEPARTMENT_OTHER): Payer: Self-pay

## 2023-10-29 DIAGNOSIS — E119 Type 2 diabetes mellitus without complications: Secondary | ICD-10-CM

## 2023-10-30 ENCOUNTER — Other Ambulatory Visit (HOSPITAL_BASED_OUTPATIENT_CLINIC_OR_DEPARTMENT_OTHER): Payer: Self-pay

## 2023-10-30 MED ORDER — ACCU-CHEK SOFTCLIX LANCETS MISC
1.0000 | Freq: Three times a day (TID) | 2 refills | Status: AC
  Filled 2023-11-22: qty 100, 33d supply, fill #0

## 2023-10-30 NOTE — Telephone Encounter (Signed)
 Requested Prescriptions  Pending Prescriptions Disp Refills   Accu-Chek Softclix Lancets lancets 100 each 2    Sig: Check blood sugars three times a day before meals     Endocrinology: Diabetes - Testing Supplies Passed - 10/30/2023  2:10 PM      Passed - Valid encounter within last 12 months    Recent Outpatient Visits           7 months ago Type 2 diabetes mellitus without complication, without long-term current use of insulin (HCC)   [place] [facility] Grayce Sessions, NP   1 year ago Type 2 diabetes mellitus without complication, without long-term current use of insulin (HCC)   [place] [facility] Grayce Sessions, NP   1 year ago Type 2 diabetes mellitus without complication, without long-term current use of insulin (HCC)   [place] [facility] Grayce Sessions, NP   1 year ago Need for shingles vaccine   [place] [facility] Grayce Sessions, NP   3 years ago Type 2 diabetes mellitus without complication, without long-term current use of insulin (HCC)   [place] [facility] Grayce Sessions, NP               metFORMIN (GLUCOPHAGE-XR) 500 MG 24 hr tablet 120 tablet 3    Sig: Take 2 tablets (1,000 mg total) by mouth 2 (two) times daily with a meal.     Endocrinology:  Diabetes - Biguanides Failed - 10/30/2023  2:10 PM      Failed - HBA1C is between 0 and 7.9 and within 180 days    HbA1c, POC (prediabetic range)  Date Value Ref Range Status  12/27/2017 6.3 5.7 - 6.4 % Final   HbA1c, POC (controlled diabetic range)  Date Value Ref Range Status  03/28/2023 6.2 0.0 - 7.0 % Final         Failed - B12 Level in normal range and within 720 days    No results found for: "VITAMINB12"       Failed - Valid encounter within last 6 months    Recent Outpatient Visits           7 months ago Type 2 diabetes mellitus without complication, without long-term current use of  insulin (HCC)   [place] [facility] Grayce Sessions, NP   1 year ago Type 2 diabetes mellitus without complication, without long-term current use of insulin (HCC)   [place] [facility] Grayce Sessions, NP   1 year ago Type 2 diabetes mellitus without complication, without long-term current use of insulin (HCC)   [place] [facility] Grayce Sessions, NP   1 year ago Need for shingles vaccine   [place] [facility] Grayce Sessions, NP   3 years ago Type 2 diabetes mellitus without complication, without long-term current use of insulin (HCC)    [facility] Grayce Sessions, NP              Passed - Cr in normal range and within 360 days    Creat  Date Value Ref Range Status  10/05/2016 0.90 0.50 - 1.05 mg/dL Final    Comment:      For patients > or = 64 years of age: The upper reference limit for Creatinine is approximately 13% higher for people identified as African-American.      Creatinine, Ser  Date Value  Ref Range Status  03/28/2023 0.83 0.57 - 1.00 mg/dL Final   Creatinine, Urine  Date Value Ref Range Status  09/10/2015 336 (H) 20 - 320 mg/dL Final    Comment:    Result repeated and verified. Result confirmed by automatic dilution.          Passed - eGFR in normal range and within 360 days    GFR, Est African American  Date Value Ref Range Status  10/05/2016 83 >=60 mL/min Final   GFR calc Af Amer  Date Value Ref Range Status  12/26/2019 83 >59 mL/min/1.73 Final    Comment:    **[facility] currently reports eGFR in compliance with the current**   recommendations of the [employer]. [facility] will   update reporting as new guidelines are published from the NKF-ASN   Task force.    GFR, Est Non African American  Date Value Ref Range Status  10/05/2016 72 >=60 mL/min Final   GFR calc non Af Amer  Date Value Ref Range Status   12/26/2019 72 >59 mL/min/1.73 Final   eGFR  Date Value Ref Range Status  03/28/2023 80 >59 mL/min/1.73 Final         Passed - CBC within normal limits and completed in the last 12 months    WBC  Date Value Ref Range Status  03/28/2023 7.2 3.4 - 10.8 x10E3/uL Final   RBC  Date Value Ref Range Status  03/28/2023 4.27 3.77 - 5.28 x10E6/uL Final   Hemoglobin  Date Value Ref Range Status  03/28/2023 12.8 11.1 - 15.9 g/dL Final   Hematocrit  Date Value Ref Range Status  03/28/2023 40.2 34.0 - 46.6 % Final   MCHC  Date Value Ref Range Status  03/28/2023 31.8 31.5 - 35.7 g/dL Final   [facility]  Date Value Ref Range Status  03/28/2023 30.0 26.6 - 33.0 pg Final   MCV  Date Value Ref Range Status  03/28/2023 94 79 - 97 fL Final   No results found for: "PLTCOUNTKUC", "LABPLAT", "POCPLA" RDW  Date Value Ref Range Status  03/28/2023 14.6 11.7 - 15.4 % Final

## 2023-10-30 NOTE — Telephone Encounter (Signed)
 Pt needs an appt

## 2023-10-30 NOTE — Telephone Encounter (Signed)
 Called pt and LM on VM to call back to make appointment Last RF 10/29/242 each 6 RF  Requested Prescriptions  Pending Prescriptions Disp Refills   metFORMIN (GLUCOPHAGE-XR) 500 MG 24 hr tablet 120 tablet 3    Sig: Take 2 tablets (1,000 mg total) by mouth 2 (two) times daily with a meal.     Endocrinology:  Diabetes - Biguanides Failed - 10/30/2023  2:30 PM      Failed - HBA1C is between 0 and 7.9 and within 180 days    HbA1c, POC (prediabetic range)  Date Value Ref Range Status  12/27/2017 6.3 5.7 - 6.4 % Final   HbA1c, POC (controlled diabetic range)  Date Value Ref Range Status  03/28/2023 6.2 0.0 - 7.0 % Final         Failed - B12 Level in normal range and within 720 days    No results found for: "VITAMINB12"       Failed - Valid encounter within last 6 months    Recent Outpatient Visits           7 months ago Type 2 diabetes mellitus without complication, without long-term current use of insulin (HCC)   [place] [facility] Grayce Sessions, NP   1 year ago Type 2 diabetes mellitus without complication, without long-term current use of insulin (HCC)   [place] [facility] Grayce Sessions, NP   1 year ago Type 2 diabetes mellitus without complication, without long-term current use of insulin (HCC)   [place] [facility] Grayce Sessions, NP   1 year ago Need for shingles vaccine   [place] [facility] Grayce Sessions, NP   3 years ago Type 2 diabetes mellitus without complication, without long-term current use of insulin (HCC)   [place] [facility] Grayce Sessions, NP              Passed - Cr in normal range and within 360 days    Creat  Date Value Ref Range Status  10/05/2016 0.90 0.50 - 1.05 mg/dL Final    Comment:      For patients > or = 64 years of age: The upper reference limit for Creatinine is approximately 13% higher for people identified  as African-American.      Creatinine, Ser  Date Value Ref Range Status  03/28/2023 0.83 0.57 - 1.00 mg/dL Final   Creatinine, Urine  Date Value Ref Range Status  09/10/2015 336 (H) 20 - 320 mg/dL Final    Comment:    Result repeated and verified. Result confirmed by automatic dilution.          Passed - eGFR in normal range and within 360 days    GFR, Est African American  Date Value Ref Range Status  10/05/2016 83 >=60 mL/min Final   GFR calc Af Amer  Date Value Ref Range Status  12/26/2019 83 >59 mL/min/1.73 Final    Comment:    **[facility] currently reports eGFR in compliance with the current**   recommendations of the [employer]. [facility] will   update reporting as new guidelines are published from the NKF-ASN   Task force.    GFR, Est Non African American  Date Value Ref Range Status  10/05/2016 72 >=60 mL/min Final   GFR calc non Af Amer  Date Value Ref Range Status  12/26/2019 72 >59 mL/min/1.73 Final   eGFR  Date Value Ref Range Status  03/28/2023 80 >59 mL/min/1.73 Final         Passed - CBC within normal limits and completed in the last 12 months    WBC  Date Value Ref Range Status  03/28/2023 7.2 3.4 - 10.8 x10E3/uL Final   RBC  Date Value Ref Range Status  03/28/2023 4.27 3.77 - 5.28 x10E6/uL Final   Hemoglobin  Date Value Ref Range Status  03/28/2023 12.8 11.1 - 15.9 g/dL Final   Hematocrit  Date Value Ref Range Status  03/28/2023 40.2 34.0 - 46.6 % Final   MCHC  Date Value Ref Range Status  03/28/2023 31.8 31.5 - 35.7 g/dL Final   [facility]  Date Value Ref Range Status  03/28/2023 30.0 26.6 - 33.0 pg Final   MCV  Date Value Ref Range Status  03/28/2023 94 79 - 97 fL Final   No results found for: "PLTCOUNTKUC", "LABPLAT", "POCPLA" RDW  Date Value Ref Range Status  03/28/2023 14.6 11.7 - 15.4 % Final         Signed Prescriptions Disp Refills   Accu-Chek Softclix Lancets lancets 100 each 2    Sig: Check blood  sugars three times a day before meals     Endocrinology: Diabetes - Testing Supplies Passed - 10/30/2023  2:30 PM      Passed - Valid encounter within last 12 months    Recent Outpatient Visits           7 months ago Type 2 diabetes mellitus without complication, without long-term current use of insulin (HCC)   [place] [facility] Grayce Sessions, NP   1 year ago Type 2 diabetes mellitus without complication, without long-term current use of insulin (HCC)   [place] [facility] Grayce Sessions, NP   1 year ago Type 2 diabetes mellitus without complication, without long-term current use of insulin (HCC)   [place] [facility] Grayce Sessions, NP   1 year ago Need for shingles vaccine   [place] [facility] Grayce Sessions, NP   3 years ago Type 2 diabetes mellitus without complication, without long-term current use of insulin [facility])    [facility] Grayce Sessions, NP

## 2023-11-01 ENCOUNTER — Other Ambulatory Visit: Payer: Self-pay

## 2023-11-01 ENCOUNTER — Other Ambulatory Visit (INDEPENDENT_AMBULATORY_CARE_PROVIDER_SITE_OTHER): Payer: Self-pay

## 2023-11-01 DIAGNOSIS — E119 Type 2 diabetes mellitus without complications: Secondary | ICD-10-CM

## 2023-11-01 MED ORDER — METFORMIN HCL ER 500 MG PO TB24
1000.0000 mg | ORAL_TABLET | Freq: Two times a day (BID) | ORAL | 0 refills | Status: AC
  Filled 2023-11-01 – 2023-11-29 (×2): qty 120, 30d supply, fill #0

## 2023-11-02 ENCOUNTER — Other Ambulatory Visit: Payer: Self-pay

## 2023-11-09 ENCOUNTER — Other Ambulatory Visit (HOSPITAL_BASED_OUTPATIENT_CLINIC_OR_DEPARTMENT_OTHER): Payer: Self-pay

## 2023-11-12 ENCOUNTER — Other Ambulatory Visit (HOSPITAL_BASED_OUTPATIENT_CLINIC_OR_DEPARTMENT_OTHER): Payer: Self-pay

## 2023-11-13 ENCOUNTER — Other Ambulatory Visit: Payer: Self-pay

## 2023-11-22 ENCOUNTER — Other Ambulatory Visit: Payer: Self-pay

## 2023-11-22 ENCOUNTER — Other Ambulatory Visit (HOSPITAL_BASED_OUTPATIENT_CLINIC_OR_DEPARTMENT_OTHER): Payer: Self-pay

## 2023-11-22 ENCOUNTER — Other Ambulatory Visit (INDEPENDENT_AMBULATORY_CARE_PROVIDER_SITE_OTHER): Payer: Self-pay | Admitting: Primary Care

## 2023-11-22 ENCOUNTER — Encounter: Payer: Self-pay | Admitting: Pharmacist

## 2023-11-22 DIAGNOSIS — E119 Type 2 diabetes mellitus without complications: Secondary | ICD-10-CM

## 2023-11-29 ENCOUNTER — Other Ambulatory Visit (HOSPITAL_BASED_OUTPATIENT_CLINIC_OR_DEPARTMENT_OTHER): Payer: Self-pay

## 2024-05-29 ENCOUNTER — Other Ambulatory Visit (HOSPITAL_BASED_OUTPATIENT_CLINIC_OR_DEPARTMENT_OTHER): Payer: Self-pay

## 2024-05-30 ENCOUNTER — Other Ambulatory Visit (HOSPITAL_BASED_OUTPATIENT_CLINIC_OR_DEPARTMENT_OTHER): Payer: Self-pay

## 2024-06-06 ENCOUNTER — Other Ambulatory Visit (HOSPITAL_BASED_OUTPATIENT_CLINIC_OR_DEPARTMENT_OTHER): Payer: Self-pay

## 2024-06-06 ENCOUNTER — Other Ambulatory Visit (INDEPENDENT_AMBULATORY_CARE_PROVIDER_SITE_OTHER): Payer: Self-pay | Admitting: Primary Care

## 2024-06-06 DIAGNOSIS — E119 Type 2 diabetes mellitus without complications: Secondary | ICD-10-CM

## 2024-06-09 ENCOUNTER — Other Ambulatory Visit (HOSPITAL_BASED_OUTPATIENT_CLINIC_OR_DEPARTMENT_OTHER): Payer: Self-pay

## 2024-06-12 ENCOUNTER — Other Ambulatory Visit (HOSPITAL_BASED_OUTPATIENT_CLINIC_OR_DEPARTMENT_OTHER): Payer: Self-pay

## 2024-06-12 MED ORDER — METFORMIN HCL ER 500 MG PO TB24
1000.0000 mg | ORAL_TABLET | Freq: Two times a day (BID) | ORAL | 2 refills | Status: AC
  Filled 2024-06-12 – 2024-06-26 (×2): qty 180, 45d supply, fill #0

## 2024-06-12 MED ORDER — LISINOPRIL-HYDROCHLOROTHIAZIDE 20-25 MG PO TABS
1.0000 | ORAL_TABLET | Freq: Every day | ORAL | 2 refills | Status: AC
  Filled 2024-06-12 – 2024-06-26 (×4): qty 90, 90d supply, fill #0

## 2024-06-12 MED ORDER — ATORVASTATIN CALCIUM 20 MG PO TABS
20.0000 mg | ORAL_TABLET | Freq: Every day | ORAL | 2 refills | Status: AC
  Filled 2024-06-12 – 2024-06-26 (×2): qty 90, 90d supply, fill #0

## 2024-06-17 ENCOUNTER — Other Ambulatory Visit (HOSPITAL_BASED_OUTPATIENT_CLINIC_OR_DEPARTMENT_OTHER): Payer: Self-pay

## 2024-06-20 ENCOUNTER — Other Ambulatory Visit (HOSPITAL_BASED_OUTPATIENT_CLINIC_OR_DEPARTMENT_OTHER): Payer: Self-pay

## 2024-06-23 ENCOUNTER — Other Ambulatory Visit (HOSPITAL_BASED_OUTPATIENT_CLINIC_OR_DEPARTMENT_OTHER): Payer: Self-pay

## 2024-06-26 ENCOUNTER — Other Ambulatory Visit (HOSPITAL_BASED_OUTPATIENT_CLINIC_OR_DEPARTMENT_OTHER): Payer: Self-pay
